# Patient Record
Sex: Female | Born: 1955 | Race: White | Hispanic: No | Marital: Married | Smoking: Former smoker
Health system: Southern US, Community
[De-identification: ages and names within clinical notes are randomized; demographics above are authoritative.]

## PROBLEM LIST (undated history)

## (undated) DIAGNOSIS — F3289 Other specified depressive episodes: Secondary | ICD-10-CM

## (undated) DIAGNOSIS — I499 Cardiac arrhythmia, unspecified: Secondary | ICD-10-CM

## (undated) DIAGNOSIS — B009 Herpesviral infection, unspecified: Secondary | ICD-10-CM

## (undated) DIAGNOSIS — K219 Gastro-esophageal reflux disease without esophagitis: Secondary | ICD-10-CM

## (undated) DIAGNOSIS — F329 Major depressive disorder, single episode, unspecified: Secondary | ICD-10-CM

## (undated) DIAGNOSIS — J45909 Unspecified asthma, uncomplicated: Secondary | ICD-10-CM

## (undated) DIAGNOSIS — M1990 Unspecified osteoarthritis, unspecified site: Secondary | ICD-10-CM

## (undated) DIAGNOSIS — H9190 Unspecified hearing loss, unspecified ear: Secondary | ICD-10-CM

## (undated) DIAGNOSIS — F32A Depression, unspecified: Secondary | ICD-10-CM

## (undated) DIAGNOSIS — G4700 Insomnia, unspecified: Secondary | ICD-10-CM

## (undated) HISTORY — DX: Major depressive disorder, single episode, unspecified: F32.9

## (undated) HISTORY — PX: TONSILLECTOMY AND ADENOIDECTOMY: SHX28

## (undated) HISTORY — DX: Unspecified hearing loss, unspecified ear: H91.90

## (undated) HISTORY — DX: Other specified depressive episodes: F32.89

## (undated) HISTORY — DX: Herpesviral infection, unspecified: B00.9

## (undated) HISTORY — PX: JOINT REPLACEMENT: SHX530

## (undated) HISTORY — DX: Gastro-esophageal reflux disease without esophagitis: K21.9

## (undated) HISTORY — DX: Insomnia, unspecified: G47.00

## (undated) HISTORY — PX: AUGMENTATION MAMMAPLASTY: SUR837

---

## 1983-10-12 HISTORY — PX: ABDOMINAL HYSTERECTOMY: SHX81

## 2004-08-18 ENCOUNTER — Ambulatory Visit: Payer: Self-pay | Admitting: General Practice

## 2005-01-15 ENCOUNTER — Ambulatory Visit: Payer: Self-pay

## 2005-01-20 ENCOUNTER — Ambulatory Visit: Payer: Self-pay

## 2005-08-18 ENCOUNTER — Inpatient Hospital Stay: Payer: Self-pay | Admitting: General Practice

## 2005-08-24 ENCOUNTER — Encounter: Payer: Self-pay | Admitting: General Practice

## 2005-09-10 ENCOUNTER — Encounter: Payer: Self-pay | Admitting: General Practice

## 2005-10-19 ENCOUNTER — Ambulatory Visit: Payer: Self-pay | Admitting: Pain Medicine

## 2006-01-24 ENCOUNTER — Ambulatory Visit: Payer: Self-pay | Admitting: Pain Medicine

## 2006-02-03 ENCOUNTER — Ambulatory Visit: Payer: Self-pay | Admitting: Pain Medicine

## 2006-02-16 ENCOUNTER — Ambulatory Visit: Payer: Self-pay | Admitting: Pain Medicine

## 2006-02-24 ENCOUNTER — Ambulatory Visit: Payer: Self-pay | Admitting: Pain Medicine

## 2006-03-02 ENCOUNTER — Ambulatory Visit: Payer: Self-pay

## 2006-03-14 ENCOUNTER — Ambulatory Visit: Payer: Self-pay | Admitting: Pain Medicine

## 2007-03-18 ENCOUNTER — Ambulatory Visit: Payer: Self-pay | Admitting: Emergency Medicine

## 2007-04-03 ENCOUNTER — Ambulatory Visit: Payer: Self-pay | Admitting: Internal Medicine

## 2007-06-13 ENCOUNTER — Ambulatory Visit: Payer: Self-pay | Admitting: Pain Medicine

## 2007-07-18 ENCOUNTER — Ambulatory Visit: Payer: Self-pay

## 2007-08-10 ENCOUNTER — Ambulatory Visit: Payer: Self-pay | Admitting: Obstetrics and Gynecology

## 2007-10-04 ENCOUNTER — Ambulatory Visit: Payer: Self-pay | Admitting: Urology

## 2009-05-29 ENCOUNTER — Ambulatory Visit: Payer: Self-pay

## 2009-06-26 ENCOUNTER — Ambulatory Visit: Payer: Self-pay | Admitting: Cardiology

## 2009-07-01 ENCOUNTER — Ambulatory Visit: Payer: Self-pay | Admitting: Cardiology

## 2009-07-25 ENCOUNTER — Other Ambulatory Visit: Payer: Self-pay | Admitting: General Surgery

## 2009-07-27 ENCOUNTER — Ambulatory Visit: Payer: Self-pay | Admitting: General Surgery

## 2010-06-02 ENCOUNTER — Ambulatory Visit: Payer: Self-pay

## 2010-06-07 ENCOUNTER — Ambulatory Visit: Payer: Self-pay | Admitting: Internal Medicine

## 2010-07-07 ENCOUNTER — Ambulatory Visit: Payer: Self-pay | Admitting: Internal Medicine

## 2010-10-11 HISTORY — PX: ESOPHAGOGASTRODUODENOSCOPY: SHX1529

## 2011-06-24 ENCOUNTER — Ambulatory Visit: Payer: Self-pay

## 2011-06-28 ENCOUNTER — Ambulatory Visit: Payer: Self-pay | Admitting: Internal Medicine

## 2011-07-15 ENCOUNTER — Ambulatory Visit: Payer: Self-pay | Admitting: Unknown Physician Specialty

## 2011-07-20 LAB — PATHOLOGY REPORT

## 2011-08-23 ENCOUNTER — Ambulatory Visit: Payer: Self-pay

## 2012-06-27 ENCOUNTER — Ambulatory Visit: Payer: Self-pay | Admitting: Internal Medicine

## 2012-06-30 ENCOUNTER — Ambulatory Visit: Payer: Self-pay | Admitting: Internal Medicine

## 2012-06-30 LAB — CBC WITH DIFFERENTIAL/PLATELET
Basophil %: 1.2 %
Eosinophil #: 0.1 10*3/uL (ref 0.0–0.7)
HCT: 41.2 % (ref 35.0–47.0)
Lymphocyte %: 38.1 %
MCHC: 34.2 g/dL (ref 32.0–36.0)
Monocyte %: 6.5 %
Neutrophil #: 2.7 10*3/uL (ref 1.4–6.5)

## 2012-06-30 LAB — HEPATIC FUNCTION PANEL A (ARMC)
Bilirubin, Direct: 0.1 mg/dL (ref 0.00–0.20)
Bilirubin,Total: 0.4 mg/dL (ref 0.2–1.0)
SGOT(AST): 14 U/L — ABNORMAL LOW (ref 15–37)
SGPT (ALT): 28 U/L (ref 12–78)
Total Protein: 7.3 g/dL (ref 6.4–8.2)

## 2012-07-16 ENCOUNTER — Ambulatory Visit: Payer: Self-pay

## 2012-08-30 ENCOUNTER — Other Ambulatory Visit: Payer: Self-pay | Admitting: Orthopedic Surgery

## 2012-09-26 ENCOUNTER — Ambulatory Visit: Payer: Self-pay | Admitting: Internal Medicine

## 2012-10-30 ENCOUNTER — Encounter (HOSPITAL_BASED_OUTPATIENT_CLINIC_OR_DEPARTMENT_OTHER): Payer: Self-pay | Admitting: *Deleted

## 2012-11-02 ENCOUNTER — Encounter (HOSPITAL_BASED_OUTPATIENT_CLINIC_OR_DEPARTMENT_OTHER): Payer: Self-pay

## 2012-11-02 ENCOUNTER — Encounter (HOSPITAL_BASED_OUTPATIENT_CLINIC_OR_DEPARTMENT_OTHER): Payer: Self-pay | Admitting: *Deleted

## 2012-11-02 ENCOUNTER — Encounter (HOSPITAL_BASED_OUTPATIENT_CLINIC_OR_DEPARTMENT_OTHER): Payer: Self-pay | Admitting: Certified Registered Nurse Anesthetist

## 2012-11-02 ENCOUNTER — Encounter (HOSPITAL_BASED_OUTPATIENT_CLINIC_OR_DEPARTMENT_OTHER)
Admission: RE | Disposition: A | Discharge status: Home / Self Care | Payer: Self-pay | Source: Ambulatory Visit | Attending: Orthopedic Surgery

## 2012-11-02 ENCOUNTER — Ambulatory Visit (HOSPITAL_BASED_OUTPATIENT_CLINIC_OR_DEPARTMENT_OTHER): Payer: 59 | Admitting: Certified Registered Nurse Anesthetist

## 2012-11-02 ENCOUNTER — Ambulatory Visit (HOSPITAL_BASED_OUTPATIENT_CLINIC_OR_DEPARTMENT_OTHER)
Admission: RE | Admit: 2012-11-02 | Discharge: 2012-11-03 | Disposition: A | Discharge status: Home / Self Care | Payer: 59 | Source: Ambulatory Visit | Attending: Orthopedic Surgery | Admitting: Orthopedic Surgery

## 2012-11-02 DIAGNOSIS — F411 Generalized anxiety disorder: Secondary | ICD-10-CM | POA: Insufficient documentation

## 2012-11-02 DIAGNOSIS — G8929 Other chronic pain: Secondary | ICD-10-CM | POA: Insufficient documentation

## 2012-11-02 DIAGNOSIS — J45909 Unspecified asthma, uncomplicated: Secondary | ICD-10-CM | POA: Insufficient documentation

## 2012-11-02 DIAGNOSIS — M19049 Primary osteoarthritis, unspecified hand: Secondary | ICD-10-CM | POA: Insufficient documentation

## 2012-11-02 HISTORY — PX: CARPOMETACARPAL (CMC) FUSION OF THUMB: SHX6290

## 2012-11-02 HISTORY — DX: Depression, unspecified: F32.A

## 2012-11-02 HISTORY — DX: Unspecified osteoarthritis, unspecified site: M19.90

## 2012-11-02 HISTORY — DX: Unspecified asthma, uncomplicated: J45.909

## 2012-11-02 HISTORY — DX: Major depressive disorder, single episode, unspecified: F32.9

## 2012-11-02 HISTORY — DX: Cardiac arrhythmia, unspecified: I49.9

## 2012-11-02 SURGERY — CARPOMETACARPAL (CMC) FUSION OF THUMB
Anesthesia: Regional | Site: Wrist | Laterality: Left | Wound class: Clean

## 2012-11-02 MED ORDER — OXYCODONE HCL 5 MG PO TABS: 5.0000 mg | ORAL_TABLET | ORAL | Status: DC | PRN

## 2012-11-02 MED ORDER — METHOCARBAMOL 500 MG PO TABS: 500.0000 mg | ORAL_TABLET | Freq: Four times a day (QID) | ORAL | Status: DC

## 2012-11-02 MED ORDER — MIDAZOLAM HCL 5 MG/5ML IJ SOLN
INTRAMUSCULAR | Status: DC | PRN
  Administered 2012-11-02: 1 mg via INTRAVENOUS

## 2012-11-02 MED ORDER — ONDANSETRON HCL 4 MG/2ML IJ SOLN
INTRAMUSCULAR | Status: DC | PRN
  Administered 2012-11-02: 4 mg via INTRAVENOUS

## 2012-11-02 MED ORDER — MIDAZOLAM HCL 2 MG/2ML IJ SOLN
1.0000 mg | INTRAMUSCULAR | Status: DC | PRN
  Administered 2012-11-02: 2 mg via INTRAVENOUS

## 2012-11-02 MED ORDER — LIDOCAINE HCL (CARDIAC) 20 MG/ML IV SOLN
INTRAVENOUS | Status: DC | PRN
  Administered 2012-11-02: 80 mg via INTRAVENOUS

## 2012-11-02 MED ORDER — LACTATED RINGERS IV SOLN
INTRAVENOUS | Status: DC
  Administered 2012-11-02: 12:00:00 via INTRAVENOUS

## 2012-11-02 MED ORDER — DEXAMETHASONE SODIUM PHOSPHATE 10 MG/ML IJ SOLN
INTRAMUSCULAR | Status: DC | PRN
  Administered 2012-11-02: 10 mg via INTRAVENOUS

## 2012-11-02 MED ORDER — FAMOTIDINE 20 MG PO TABS: 20.0000 mg | ORAL_TABLET | Freq: Two times a day (BID) | ORAL | Status: DC | PRN

## 2012-11-02 MED ORDER — PROPOFOL 10 MG/ML IV BOLUS
INTRAVENOUS | Status: DC | PRN
  Administered 2012-11-02: 200 mg via INTRAVENOUS

## 2012-11-02 MED ORDER — BUPROPION HCL ER (XL) 150 MG PO TB24: 150.0000 mg | ORAL_TABLET | Freq: Every day | ORAL | Status: DC

## 2012-11-02 MED ORDER — LACTATED RINGERS IV SOLN
INTRAVENOUS | Status: DC
  Administered 2012-11-02: 75 mL/h via INTRAVENOUS

## 2012-11-02 MED ORDER — CEFAZOLIN SODIUM 1-5 GM-% IV SOLN
1.0000 g | Freq: Three times a day (TID) | INTRAVENOUS | Status: DC
  Administered 2012-11-02 – 2012-11-03 (×3): 1 g via INTRAVENOUS

## 2012-11-02 MED ORDER — HYDROMORPHONE HCL PF 1 MG/ML IJ SOLN
0.2500 mg | INTRAMUSCULAR | Status: DC | PRN
  Administered 2012-11-02 (×4): 0.5 mg via INTRAVENOUS

## 2012-11-02 MED ORDER — FENTANYL CITRATE 0.05 MG/ML IJ SOLN
50.0000 ug | INTRAMUSCULAR | Status: DC | PRN
  Administered 2012-11-02: 100 ug via INTRAVENOUS

## 2012-11-02 MED ORDER — CHLORHEXIDINE GLUCONATE 4 % EX LIQD: 60.0000 mL | Freq: Once | CUTANEOUS | Status: DC

## 2012-11-02 MED ORDER — CEFAZOLIN SODIUM-DEXTROSE 2-3 GM-% IV SOLR
2.0000 g | INTRAVENOUS | Status: AC
  Administered 2012-11-02: 2 g via INTRAVENOUS

## 2012-11-02 MED ORDER — ALBUTEROL SULFATE HFA 108 (90 BASE) MCG/ACT IN AERS: 2.0000 | INHALATION_SPRAY | Freq: Every day | RESPIRATORY_TRACT | Status: DC | PRN

## 2012-11-02 MED ORDER — METOPROLOL SUCCINATE ER 50 MG PO TB24: 50.0000 mg | ORAL_TABLET | Freq: Every day | ORAL | Status: DC

## 2012-11-02 MED ORDER — MORPHINE SULFATE 2 MG/ML IJ SOLN
1.0000 mg | INTRAMUSCULAR | Status: DC | PRN
  Administered 2012-11-02 – 2012-11-03 (×4): 1 mg via INTRAVENOUS

## 2012-11-02 MED ORDER — ALPRAZOLAM 0.5 MG PO TABS
0.5000 mg | ORAL_TABLET | Freq: Four times a day (QID) | ORAL | Status: DC | PRN
  Administered 2012-11-03: 0.5 mg via ORAL

## 2012-11-02 MED ORDER — DOCUSATE SODIUM 100 MG PO CAPS: 100.0000 mg | ORAL_CAPSULE | Freq: Two times a day (BID) | ORAL | Status: DC

## 2012-11-02 MED ORDER — VITAMIN C 500 MG PO TABS
1000.0000 mg | ORAL_TABLET | Freq: Every day | ORAL | Status: DC
  Administered 2012-11-02: 1000 mg via ORAL

## 2012-11-02 MED ORDER — CITALOPRAM HYDROBROMIDE 40 MG PO TABS: 40.0000 mg | ORAL_TABLET | Freq: Every day | ORAL | Status: DC

## 2012-11-02 MED ORDER — OXYCODONE HCL 5 MG PO TABS
5.0000 mg | ORAL_TABLET | ORAL | Status: DC | PRN
  Administered 2012-11-02 (×2): 10 mg via ORAL

## 2012-11-02 MED ORDER — SODIUM CHLORIDE 0.45 % IV SOLN: INTRAVENOUS | Status: DC

## 2012-11-02 SURGICAL SUPPLY — 67 items
BANDAGE CONFORM 2  STR LF (GAUZE/BANDAGES/DRESSINGS) ×2 IMPLANT
BANDAGE CONFORM 3  STR LF (GAUZE/BANDAGES/DRESSINGS) IMPLANT
BANDAGE ELASTIC 3 VELCRO ST LF (GAUZE/BANDAGES/DRESSINGS) ×2 IMPLANT
BANDAGE GAUZE ELAST BULKY 4 IN (GAUZE/BANDAGES/DRESSINGS) ×2 IMPLANT
BLADE SURG 15 STRL LF DISP TIS (BLADE) ×3 IMPLANT
BLADE SURG 15 STRL SS (BLADE) ×3
BLADE SURG ROTATE 9660 (MISCELLANEOUS) IMPLANT
BRUSH SCRUB EZ PLAIN DRY (MISCELLANEOUS) ×2 IMPLANT
CANISTER SUCTION 1200CC (MISCELLANEOUS) IMPLANT
CLOTH BEACON ORANGE TIMEOUT ST (SAFETY) ×2 IMPLANT
CORDS BIPOLAR (ELECTRODE) ×2 IMPLANT
COVER MAYO STAND STRL (DRAPES) ×2 IMPLANT
COVER TABLE BACK 60X90 (DRAPES) ×2 IMPLANT
CUFF TOURNIQUET SINGLE 18IN (TOURNIQUET CUFF) ×2 IMPLANT
DECANTER SPIKE VIAL GLASS SM (MISCELLANEOUS) IMPLANT
DRAIN TLS ROUND 10FR (DRAIN) IMPLANT
DRAPE EXTREMITY T 121X128X90 (DRAPE) ×2 IMPLANT
DRAPE OEC MINIVIEW 54X84 (DRAPES) IMPLANT
DRAPE SURG 17X23 STRL (DRAPES) ×2 IMPLANT
DRSG EMULSION OIL 3X3 NADH (GAUZE/BANDAGES/DRESSINGS) ×2 IMPLANT
GAUZE SPONGE 4X4 16PLY XRAY LF (GAUZE/BANDAGES/DRESSINGS) IMPLANT
GLOVE BIO SURGEON STRL SZ 6.5 (GLOVE) ×6 IMPLANT
GLOVE BIOGEL M STRL SZ7.5 (GLOVE) ×2 IMPLANT
GLOVE BIOGEL PI IND STRL 7.0 (GLOVE) ×1 IMPLANT
GLOVE BIOGEL PI INDICATOR 7.0 (GLOVE) ×1
GLOVE SS BIOGEL STRL SZ 8 (GLOVE) ×1 IMPLANT
GLOVE SUPERSENSE BIOGEL SZ 8 (GLOVE) ×1
GOWN PREVENTION PLUS XLARGE (GOWN DISPOSABLE) ×6 IMPLANT
GOWN PREVENTION PLUS XXLARGE (GOWN DISPOSABLE) ×2 IMPLANT
GUIDEWIRE THREADED 150MM (WIRE) ×2 IMPLANT
NEEDLE HYPO 22GX1.5 SAFETY (NEEDLE) IMPLANT
NEEDLE HYPO 25X1 1.5 SAFETY (NEEDLE) ×2 IMPLANT
NS IRRIG 1000ML POUR BTL (IV SOLUTION) ×2 IMPLANT
PACK BASIN DAY SURGERY FS (CUSTOM PROCEDURE TRAY) ×2 IMPLANT
PAD CAST 3X4 CTTN HI CHSV (CAST SUPPLIES) ×2 IMPLANT
PADDING CAST ABS 3INX4YD NS (CAST SUPPLIES) ×1
PADDING CAST ABS 4INX4YD NS (CAST SUPPLIES) ×1
PADDING CAST ABS COTTON 3X4 (CAST SUPPLIES) ×1 IMPLANT
PADDING CAST ABS COTTON 4X4 ST (CAST SUPPLIES) ×1 IMPLANT
PADDING CAST COTTON 3X4 STRL (CAST SUPPLIES) ×2
PASSER SUT SWANSON 36MM LOOP (INSTRUMENTS) ×2 IMPLANT
SHEET MEDIUM DRAPE 40X70 STRL (DRAPES) ×2 IMPLANT
SPLINT FIBERGLASS 3X35 (CAST SUPPLIES) ×2 IMPLANT
SPLINT PLASTER CAST XFAST 3X15 (CAST SUPPLIES) IMPLANT
SPLINT PLASTER XTRA FASTSET 3X (CAST SUPPLIES)
SPONGE GAUZE 4X4 12PLY (GAUZE/BANDAGES/DRESSINGS) ×2 IMPLANT
SPONGE SURGIFOAM ABS GEL 12-7 (HEMOSTASIS) ×2 IMPLANT
STOCKINETTE 4X48 STRL (DRAPES) ×2 IMPLANT
STOCKINETTE SYNTHETIC 3 UNSTER (CAST SUPPLIES) ×2 IMPLANT
SUCTION FRAZIER TIP 10 FR DISP (SUCTIONS) IMPLANT
SUT BONE WAX W31G (SUTURE) IMPLANT
SUT FIBERWIRE 3-0 18 TAPR NDL (SUTURE) ×6
SUT FIBERWIRE 4-0 18 TAPR NDL (SUTURE)
SUT PROLENE 4 0 PS 2 18 (SUTURE) ×4 IMPLANT
SUT VIC AB 4-0 P-3 18XBRD (SUTURE) IMPLANT
SUT VIC AB 4-0 P3 18 (SUTURE)
SUTURE FIBERWR 3-0 18 TAPR NDL (SUTURE) ×3 IMPLANT
SUTURE FIBERWR 4-0 18 TAPR NDL (SUTURE) IMPLANT
SYR BULB 3OZ (MISCELLANEOUS) ×2 IMPLANT
SYR CONTROL 10ML LL (SYRINGE) IMPLANT
TAPE SURG TRANSPORE 1 IN (GAUZE/BANDAGES/DRESSINGS) IMPLANT
TAPE SURGICAL TRANSPORE 1 IN (GAUZE/BANDAGES/DRESSINGS)
TOWEL OR 17X24 6PK STRL BLUE (TOWEL DISPOSABLE) ×4 IMPLANT
TOWEL OR NON WOVEN STRL DISP B (DISPOSABLE) ×2 IMPLANT
TUBE CONNECTING 20X1/4 (TUBING) IMPLANT
UNDERPAD 30X30 INCONTINENT (UNDERPADS AND DIAPERS) ×2 IMPLANT
WATER STERILE IRR 1000ML POUR (IV SOLUTION) ×2 IMPLANT

## 2012-11-02 NOTE — Progress Notes (Signed)
Assisted Dr. Edwards with left, ultrasound guided, supraclavicular block. Side rails up, monitors on throughout procedure. See vital signs in flow sheet. Tolerated Procedure well. 

## 2012-11-02 NOTE — H&P (Signed)
Veronica Clayton is an 57 y.o. female.   Chief Complaint: Left thumb CMC Dz HPI: Marland KitchenMarland KitchenPatient presents for evaluation and treatment of the of their upper extremity predicament. The patient denies neck back chest or of abdominal pain. The patient notes that they have no lower extremity problems. The patient from primarily complains of the upper extremity pain noted.  Past Medical History  Diagnosis Date  . Dysrhythmia dx 2008    tachycardia  . Asthma     bronchial asthma-no issue recently  . Arthritis   . Depression     Past Surgical History  Procedure Date  . Abdominal hysterectomy 1985  . Joint replacement 2005/2006    knee-Rt partial/ Lt-total  . Tonsillectomy   . Tonsillectomy and adenoidectomy as child    History reviewed. No pertinent family history. Social History:  reports that she quit smoking about 5 years ago. Her smoking use included Cigarettes. She has a 20 pack-year smoking history. She has never used smokeless tobacco. She reports that she drinks alcohol. Her drug history not on file.  Allergies:  Allergies  Allergen Reactions  . Tetracyclines & Related Hives  . Tape Itching    Plastic tape and bandaids causes redness and itching    Medications Prior to Admission  Medication Sig Dispense Refill  . acetaminophen (TYLENOL) 500 MG tablet Take 1,000 mg by mouth every 6 (six) hours as needed.      Marland Kitchen albuterol (PROVENTIL HFA;VENTOLIN HFA) 108 (90 BASE) MCG/ACT inhaler Inhale 2 puffs into the lungs daily as needed.      Marland Kitchen aspirin 81 MG tablet Take 81 mg by mouth daily.      Marland Kitchen buPROPion (WELLBUTRIN XL) 150 MG 24 hr tablet Take 150 mg by mouth daily.      . citalopram (CELEXA) 40 MG tablet Take 40 mg by mouth daily.      Marland Kitchen estradiol (ESTRACE) 1 MG tablet Take 1 mg by mouth daily.      Marland Kitchen ibuprofen (ADVIL,MOTRIN) 800 MG tablet Take 800 mg by mouth at bedtime.      . metoprolol succinate (TOPROL-XL) 50 MG 24 hr tablet Take 50 mg by mouth daily. Take with or immediately  following a meal.      . Multiple Vitamins-Minerals (PRESERVISION/LUTEIN) CAPS Take 1 capsule by mouth daily.      Marland Kitchen zolpidem (AMBIEN) 10 MG tablet Take 10 mg by mouth at bedtime as needed.        No results found for this or any previous visit (from the past 48 hour(s)). No results found.  Review of Systems  Constitutional: Negative.   HENT: Negative.   Eyes: Negative.   Respiratory: Negative.   Cardiovascular: Negative.   Gastrointestinal: Negative.   Genitourinary: Negative.   Skin: Negative.   Endo/Heme/Allergies: Negative.     Blood pressure 123/66, pulse 80, temperature 97.6 F (36.4 C), temperature source Oral, resp. rate 18, height 5\' 6"  (1.676 m), weight 77.111 kg (170 lb), SpO2 100.00%. Physical Exam ..The patient is alert and oriented in no acute distress the patient complains of pain in the affected upper extremity. The patient is noted to have a normal HEENT exam. Lung fields show equal chest expansion and no shortness of breath abdomen exam is nontender without distention. Lower extremity examination does not show any fracture dislocation or blood clot symptoms. Pelvis is stable neck and back are stable and nontender  Assessment/Plan Plan Left CMC Aplasty .Marland KitchenWe are planning surgery for your upper extremity. The risk and  benefits of surgery include risk of bleeding infection anesthesia damage to normal structures and failure of the surgery to accomplish its intended goals of relieving symptoms and restoring function with this in mind we'll going to proceed. I have specifically discussed with the patient the pre-and postoperative regime and the does and don'ts and risk and benefits in great detail. Risk and benefits of surgery also include risk of dystrophy chronic nerve pain failure of the healing process to go onto completion and other inherent risks of surgery The relavent the pathophysiology of the disease/injury process, as well as the alternatives for treatment and  postoperative course of action has been discussed in great detail with the patient who desires to proceed.  We will do everything in our power to help you (the patient) restore function to the upper extremity. Is a pleasure to see this patient today.   Veronica Clayton Veronica Clayton,Micholas Drumwright M 11/02/2012, 1:18 PM

## 2012-11-02 NOTE — Anesthesia Preprocedure Evaluation (Addendum)
Anesthesia Evaluation  Patient identified by MRN, date of birth, ID band Patient awake    Airway Mallampati: II      Dental   Pulmonary asthma ,  breath sounds clear to auscultation        Cardiovascular Rhythm:Regular Rate:Normal     Neuro/Psych Anxiety    GI/Hepatic negative GI ROS, Neg liver ROS,   Endo/Other  negative endocrine ROS  Renal/GU negative Renal ROS     Musculoskeletal   Abdominal   Peds  Hematology   Anesthesia Other Findings   Reproductive/Obstetrics                          Anesthesia Physical Anesthesia Plan  ASA: II  Anesthesia Plan: General   Post-op Pain Management: MAC Combined w/ Regional for Post-op pain   Induction: Intravenous  Airway Management Planned: LMA  Additional Equipment:   Intra-op Plan:   Post-operative Plan: Extubation in OR  Informed Consent: I have reviewed the patients History and Physical, chart, labs and discussed the procedure including the risks, benefits and alternatives for the proposed anesthesia with the patient or authorized representative who has indicated his/her understanding and acceptance.   Dental advisory given  Plan Discussed with: CRNA, Anesthesiologist and Surgeon  Anesthesia Plan Comments:         Anesthesia Quick Evaluation

## 2012-11-02 NOTE — Anesthesia Postprocedure Evaluation (Signed)
  Anesthesia Post-op Note  Patient: Veronica Clayton  Procedure(s) Performed: Procedure(s) (LRB) with comments: CARPOMETACARPAL (CMC) FUSION OF THUMB (Left) - LEFT CMC ARTHROPLASTY WITH DOUBLE TENDON TRANSFER AND REPAIR RECONSTRUCTION AS NECCESSARY  Patient Location: PACU  Anesthesia Type:GA combined with regional for post-op pain  Level of Consciousness: awake, alert  and oriented  Airway and Oxygen Therapy: Patient Spontanous Breathing and Patient connected to face mask oxygen  Post-op Pain: mild  Post-op Assessment: Post-op Vital signs reviewed  Post-op Vital Signs: Reviewed  Complications: No apparent anesthesia complications

## 2012-11-02 NOTE — Op Note (Signed)
See dictation #[id number] Dominica Severin MD

## 2012-11-02 NOTE — Transfer of Care (Signed)
Immediate Anesthesia Transfer of Care Note  Patient: Veronica Clayton  Procedure(s) Performed: Procedure(s) (LRB) with comments: CARPOMETACARPAL (CMC) FUSION OF THUMB (Left) - LEFT CMC ARTHROPLASTY WITH DOUBLE TENDON TRANSFER AND REPAIR RECONSTRUCTION AS NECCESSARY  Patient Location: PACU  Anesthesia Type:GA combined with regional for post-op pain  Level of Consciousness: awake, alert  and oriented  Airway & Oxygen Therapy: Patient Spontanous Breathing and Patient connected to face mask oxygen  Post-op Assessment: Report given to PACU RN, Post -op Vital signs reviewed and stable and Patient moving all extremities  Post vital signs: Reviewed and stable  Complications: No apparent anesthesia complications

## 2012-11-02 NOTE — Anesthesia Procedure Notes (Addendum)
Anesthesia Regional Block:  Supraclavicular block  Pre-Anesthetic Checklist: ,, timeout performed, Correct Patient, Correct Site, Correct Laterality, Correct Procedure, Correct Position, site marked, Risks and benefits discussed,  Surgical consent,  Pre-op evaluation,  At surgeon's request and post-op pain management  Laterality: Left  Prep: Maximum Sterile Barrier Precautions used and chloraprep       Needles:  Injection technique: Single-shot  Needle Type: Echogenic Needle          Additional Needles:  Procedures: Doppler guided, ultrasound guided (picture in chart) and nerve stimulator Supraclavicular block  Nerve Stimulator or Paresthesia:  Response: 0.5 mA,   Additional Responses:   Narrative:  Start time: 11/02/2012 12:30 PM End time: 11/02/2012 12:45 PM Injection made incrementally with aspirations every 5 mL. Anesthesiologist: Dr. Ivin Booty  Supraclavicular block Procedure Name: LMA Insertion Date/Time: 11/02/2012 1:30 PM Performed by: Verlan Friends Pre-anesthesia Checklist: Patient identified, Emergency Drugs available, Suction available, Patient being monitored and Timeout performed Patient Re-evaluated:Patient Re-evaluated prior to inductionOxygen Delivery Method: Circle System Utilized Preoxygenation: Pre-oxygenation with 100% oxygen Intubation Type: IV induction Ventilation: Mask ventilation without difficulty LMA: LMA inserted LMA Size: 4.0 Number of attempts: 1 Airway Equipment and Method: bite block Placement Confirmation: positive ETCO2 Tube secured with: Tape (paper tape used) Dental Injury: Teeth and Oropharynx as per pre-operative assessment

## 2012-11-03 ENCOUNTER — Encounter (HOSPITAL_BASED_OUTPATIENT_CLINIC_OR_DEPARTMENT_OTHER): Payer: Self-pay | Admitting: Orthopedic Surgery

## 2012-11-03 MED ORDER — METHOCARBAMOL 100 MG/ML IJ SOLN: 500.0000 mg | Freq: Three times a day (TID) | INTRAVENOUS | Status: DC

## 2012-11-03 MED ORDER — OXYCODONE-ACETAMINOPHEN 5-325 MG PO TABS
1.0000 | ORAL_TABLET | ORAL | Status: AC | PRN
Start: 2012-11-03 — End: 2012-11-03
  Administered 2012-11-03: 2 via ORAL

## 2012-11-03 NOTE — Op Note (Signed)
NAMEMILANY, GECK             ACCOUNT NO.:  [id number]  MEDICAL RECORD NO.:  [id number]  LOCATION:                                 FACILITY:  PHYSICIAN:  Dionne Ano. Nikeshia Keetch, M.D.DATE OF BIRTH:  1956/06/30  DATE OF PROCEDURE:  11/02/2012 DATE OF DISCHARGE:                              OPERATIVE REPORT   PREOPERATIVE DIAGNOSIS:  Chronic carpometacarpal arthritis, left basilar thumb joint with failure of conservative management, and chronic pain.  POSTOPERATIVE DIAGNOSIS:  Chronic carpometacarpal arthritis, left basilar thumb joint with failure of conservative management, and chronic pain.  PROCEDURE: 1. Left thumb CMC arthroplasty (removal of the trapezium at the base     of thumb joint region) left basilar thumb joint. 2. Abductors pollicis longus digastric portion.  Tendon transfer to     the first metacarpal FCR and back upon itself in the first     metacarpal (Zancolli tendon transfer) left basilar thumb joint. 3. Adductor pollicis longus 1/3 proper portion, tendon transfer to the     flexor carpi radialis, APL proper, back upon themselves (Weilby     tendon transfer), left basilar thumb joint. 4. Abductor pollicis longus tenodesis (shortening of wrist extensors     at wrist forearm level to prevent dorsal-lateral escape, left     basilar thumb joint.)  SURGEON:  Dionne Ano. Amanda Pea, M.D.  ASSISTANT:  Karie Chimera, P.A.-C.  COMPLICATIONS:  None.  ANESTHESIA:  General with preoperative block.  TOURNIQUET TIME:  44 minutes.  INDICATIONS:  Pleasant female, presents with above-mentioned diagnosis. She understands risks and benefits of surgery.  She desires to proceed with above-mentioned operative intervention.  All questions have been encouraged and answered preoperatively.  PROCEDURE:  The patient was seen by myself and Anesthesia, taken to the operative suite, underwent smooth induction of general anaesthetic, laid supine, fully padded, prepped and draped in  usual sterile fashion with Betadine scrub and paint.  This was a 10-minute surgical Betadine scrub, time-out was called.  Pre and postop check was complete.  Ancef was given preoperatively and following this, the patient was prepped and draped in usual sterile fashion as mentioned.  Once sterile field was secured.  Arm was elevated, tourniquet was insufflated to 200 mmHg. Incision was made.  Dissection was carried down between the APL and EPB tendons.  Superficial radial nerve was identified and underwent a neurolysis and was swept out of harm's way.  The EPB had a remnant type tendon to it with poor attachments proximally.  This was gently released.  Following this, I incised the capsule removed the trapezium piecemeal,  performed FCR tenolysis, tenosynovectomy, and performed a first dorsal compartment release as well.  I made a hole dorsal to palmar exiting intra-articularly in line with the palmar beak ligament. This was enlarged to a 3-0 drill bit approximately.  Following this, she was irrigated and this completed the arthroplasty portion of the procedure with all bone spurs and the entire trapezium excised.  Following this, APL digastric portion and once a proper portion of the APL were harvested through a small counterincision at the distal dorsal third of the wrist, through a small incision less than an inch.  Once this was done,  the tendons were retracted into the main wound and I performed tendon transfer of the APL digastric portion through the drill hole dorsal to palmar exiting around the FCR twice, then back to itself twice, and back into the drill hole.  This was secured without difficulty.  Following securing the tendon transfer, I then performed very careful and cautious second tendon transfer.  The first tendon transfer is known as the Zancolli tendon transfer of course.  Following this, I performed APL, went through a proper portion of tendon transfer to the FCR, then  back around the APL, and with multiple figure-of-eight rows, I secured this construct.  Following this, I then performed very careful and cautious irrigation and noted the second tendon transfer (Weilby tendon transfer) was in excellent shape.  Following this, additional irrigation was applied.  I then shortened the APL tendon. This was tenodesis of the APL to prevent dorsal-lateral escape and was done with FiberWire.  The patient tolerated this well.  Once this done, the patient underwent very careful and cautious irrigation followed by placement of Gelfoam in the space created.  The tension looked excellent.  I closed the capsule with FiberWire.  The superficial radial nerve was intact and out of harms way.  I then reclosed the wound with Prolene without difficulty.  Sterile dressing was applied, splint placed, and the patient had no complicating features.  All sponge, needle, and instrument counts were correct.  She will be seen for IV antibiotics.  General postop observation, and will notify me, should any problems occur.  We will proceed according to our standard Zancolli protocol postoperatively.  It was an absolute pleasure to participate in her care.  We look forward to participate in her postop recovery.     Dionne Ano. Amanda Pea, M.D.     [facility]  D:  11/02/2012  T:  11/03/2012  Job:  [id number]

## 2012-11-03 NOTE — Discharge Summary (Signed)
  Patient is doing well she is neurovascularly intact she had an uneventful overnight stay. Final diagnosis status post CMC thumb reconstruction left upper extremity.  Chloe Flis M.D.

## 2012-11-03 NOTE — Progress Notes (Signed)
Patient ID: Veronica Clayton, female   DOB: 12/13/1955, 57 y.o.   MRN: [id number] Patient is doing well  She is neurovascular intact there's no complications. She looks quite good in terms of her overnight stay and functional aspects of her extremity.  Will plan for discharge today. She'll return to see me in 8 days and she'll notify me same problems occur  The patient is alert and oriented in no acute distress the patient complains of pain in the affected upper extremity. The patient is noted to have a normal HEENT exam. Lung fields show equal chest expansion and no shortness of breath abdomen exam is nontender without distention. Lower extremity examination does not show any fracture dislocation or blood clot symptoms. Pelvis is stable neck and back are stable and nontender  We went over all aspects of her surgery care and followup  She is discharged to home Dionne Ano. Zeyad Delaguila M.D.

## 2013-07-05 ENCOUNTER — Ambulatory Visit: Payer: Self-pay | Admitting: Internal Medicine

## 2013-07-19 ENCOUNTER — Encounter: Payer: Self-pay | Admitting: *Deleted

## 2013-07-20 ENCOUNTER — Ambulatory Visit (INDEPENDENT_AMBULATORY_CARE_PROVIDER_SITE_OTHER): Payer: 59 | Admitting: Cardiovascular Disease

## 2013-07-20 ENCOUNTER — Encounter: Payer: Self-pay | Admitting: Cardiovascular Disease

## 2013-07-20 VITALS — BP 126/78 | HR 60 | Ht 66.0 in | Wt 174.2 lb

## 2013-07-20 DIAGNOSIS — R002 Palpitations: Secondary | ICD-10-CM

## 2013-07-20 DIAGNOSIS — E785 Hyperlipidemia, unspecified: Secondary | ICD-10-CM

## 2013-07-20 NOTE — Assessment & Plan Note (Signed)
Palpitations could be related to premature beats. He does have family history of atrial fibrillation as well. I will obtain a 48-hour course the monitor for evaluation. ECG is slightly abnormal but she has no symptoms of angina or heart failure. Cardiac evaluation with a stress test and echocardiogram in 2010 was unremarkable.

## 2013-07-20 NOTE — Progress Notes (Signed)
Primary care physician: Dr. Judithann Graves  HPI  Veronica Clayton is a pleasant 57 year old female who was referred for evaluation of palpitations and possible atrial fibrillation. 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 She has been overall healthy with no significant chronic medical conditions. She was evaluated for chest pain and palpitations in 2010 by Dr. Wendi Maya. She underwent an echocardiogram which showed normal LV systolic function with mild mitral and tricuspid regurgitation. Nuclear stress test showed no evidence of ischemia. She was treated with metoprolol for an unspecified tachycardia. She has been taking 50 mg daily for a few years. She has noted recent episodes of skipping and fast heartbeats which typically don't last for a long time. She denies any dizziness, syncope or presyncope. No chest pain or dyspnea. Her mother had atrial fibrillation. She does not consume excessive amounts of caffeine. Recent labs were unremarkable including thyroid function.  Allergies  Allergen Reactions  . Tetracyclines & Related Hives  . Tape Itching    Plastic tape and bandaids causes redness and itching     Current Outpatient Prescriptions on File Prior to Visit  Medication Sig Dispense Refill  . acetaminophen (TYLENOL) 500 MG tablet Take 1,000 mg by mouth every 6 (six) hours as needed.      Marland Kitchen albuterol (PROVENTIL HFA;VENTOLIN HFA) 108 (90 BASE) MCG/ACT inhaler Inhale 2 puffs into the lungs daily as needed.      Marland Kitchen aspirin 81 MG tablet Take 81 mg by mouth daily.      . citalopram (CELEXA) 40 MG tablet Take 40 mg by mouth daily.      Marland Kitchen estradiol (ESTRACE) 1 MG tablet Take 1 mg by mouth 2 (two) times daily.       Marland Kitchen ibuprofen (ADVIL,MOTRIN) 800 MG tablet Take 800 mg by mouth at bedtime.      . metoprolol succinate (TOPROL-XL) 50 MG 24 hr tablet Take 50 mg by mouth daily. Take with or immediately following a meal.      . Multiple Vitamins-Minerals (PRESERVISION/LUTEIN) CAPS Take  1 capsule by mouth daily.      Marland Kitchen zolpidem (AMBIEN) 10 MG tablet Take 10 mg by mouth at bedtime as needed.       No current facility-administered medications on file prior to visit.     Past Medical History  Diagnosis Date  . Dysrhythmia dx 2008    tachycardia  . Asthma     bronchial asthma-no issue recently  . Arthritis   . Depression   . Herpes simplex without mention of complication   . Unspecified hearing loss   . Depressive disorder, not elsewhere classified   . Insomnia, unspecified   . GERD (gastroesophageal reflux disease)      Past Surgical History  Procedure Laterality Date  . Abdominal hysterectomy  1985  . Joint replacement  2005/2006    knee-Rt partial/ Lt-total  . Tonsillectomy    . Tonsillectomy and adenoidectomy  as child  . Carpometacarpal (cmc) fusion of thumb  11/02/2012    Procedure: CARPOMETACARPAL (CMC) FUSION OF THUMB;  Surgeon: Dominica Severin, MD;  Location: [facility];  Service: Orthopedics;  Laterality: Left;  LEFT CMC ARTHROPLASTY WITH DOUBLE TENDON TRANSFER AND REPAIR RECONSTRUCTION AS NECCESSARY     Family History  Problem Relation Age of Onset  . Scleroderma Father   . Colon cancer Father   . Hypertension Mother   . Hyperlipidemia Mother      History   Social History  .  Marital Status: Married    Spouse Name: N/A    Number of Children: N/A  . Years of Education: N/A   Occupational History  . Not on file.   Social History Main Topics  . Smoking status: Former Smoker -- 1.00 packs/day for 20 years    Types: Cigarettes    Quit date: 10/31/2007  . Smokeless tobacco: Never Used  . Alcohol Use: Yes     Comment: occ beer  . Drug Use: No  . Sexual Activity: Not on file   Other Topics Concern  . Not on file   Social History Narrative  . No narrative on file     ROS A 10 point review of system was performed. It is negative other than what is mentioned in history of present illness.  PHYSICAL EXAM   BP  126/78  Pulse 60  Ht 5\' 6"  (1.676 m)  Wt 174 lb 4 oz (79.039 kg)  BMI 28.14 kg/m2 Constitutional: She is oriented to person, place, and time. She appears well-developed and well-nourished. No distress.  HENT: No nasal discharge.  Head: Normocephalic and atraumatic.  Eyes: Pupils are equal and round. Right eye exhibits no discharge. Left eye exhibits no discharge.  Neck: Normal range of motion. Neck supple. No JVD present. No thyromegaly present.  Cardiovascular: Normal rate, regular rhythm, normal heart sounds. Exam reveals no gallop and no friction rub. No murmur heard.  Pulmonary/Chest: Effort normal and breath sounds normal. No stridor. No respiratory distress. She has no wheezes. She has no rales. She exhibits no tenderness.  Abdominal: Soft. Bowel sounds are normal. She exhibits no distension. There is no tenderness. There is no rebound and no guarding.  Musculoskeletal: Normal range of motion. She exhibits no edema and no tenderness.  Neurological: She is alert and oriented to person, place, and time. Coordination normal.  Skin: Skin is warm and dry. No rash noted. She is not diaphoretic. No erythema. No pallor.  Psychiatric: She has a normal mood and affect. Her behavior is normal. Judgment and thought content normal.     EKG: Normal sinus rhythm with nonspecific ST and T wave changes in the anterior leads.   ASSESSMENT AND PLAN

## 2013-07-20 NOTE — Assessment & Plan Note (Signed)
I reviewed her recent lipid profile which showed a total cholesterol of 254, triglyceride of 199, HDL 40 and an LDL of 166. Her 10 year risk for  ASCVD is 2.7% which is overall low. Thus, no indication for statin treatment at this point. I advised her to improve her diet and start an exercise program.

## 2013-07-20 NOTE — Patient Instructions (Signed)
Your physician has recommended that you wear a holter monitor. Holter monitors are medical devices that record the heart's electrical activity. Doctors most often use these monitors to diagnose arrhythmias. Arrhythmias are problems with the speed or rhythm of the heartbeat. The monitor is a small, portable device. You can wear one while you do your normal daily activities. This is usually used to diagnose what is causing palpitations/syncope (passing out).  Follow up as needed.  

## 2013-07-27 ENCOUNTER — Encounter: Payer: Self-pay | Admitting: *Deleted

## 2013-07-27 ENCOUNTER — Telehealth: Payer: Self-pay | Admitting: *Deleted

## 2013-07-27 NOTE — Telephone Encounter (Signed)
I called the patient and notified her of Holter monitor which showed PVCs.  I want her to be scheduled for an echocardiogram (PVCs). She prefers 10/27 or 10/30.

## 2013-07-27 NOTE — Telephone Encounter (Signed)
Please call with holter moniter she is going out of town on Sunday

## 2013-07-27 NOTE — Telephone Encounter (Signed)
We just got report back today. Pt would like results by the end of the day, as she is going out of town this weekend.  Thank you!

## 2013-07-30 ENCOUNTER — Other Ambulatory Visit: Payer: Self-pay

## 2013-07-30 ENCOUNTER — Encounter (INDEPENDENT_AMBULATORY_CARE_PROVIDER_SITE_OTHER): Payer: 59

## 2013-07-30 DIAGNOSIS — R002 Palpitations: Secondary | ICD-10-CM

## 2013-08-02 ENCOUNTER — Telehealth: Payer: Self-pay

## 2013-08-02 NOTE — Telephone Encounter (Signed)
Message copied by Coralee Rud on Thu Aug 02, 2013  8:55 AM ------      Message from: Rhea Belton R      Created: Wed Aug 01, 2013 12:33 PM       Dr. Kirke Corin sent me a note:             "I want her to be scheduled for an echocardiogram (PVCs). She prefers 10/27 or 10/30."            Could you set this up for her?  I have not yet mastered the echo schedule.  It scares me.         ------

## 2013-08-02 NOTE — Telephone Encounter (Signed)
LMOM to call back.(schedule echo)

## 2013-08-16 ENCOUNTER — Other Ambulatory Visit: Payer: Self-pay

## 2013-08-16 DIAGNOSIS — R002 Palpitations: Secondary | ICD-10-CM

## 2013-08-16 DIAGNOSIS — R079 Chest pain, unspecified: Secondary | ICD-10-CM

## 2013-08-17 ENCOUNTER — Ambulatory Visit: Payer: Self-pay | Admitting: Family Medicine

## 2013-08-21 ENCOUNTER — Other Ambulatory Visit (INDEPENDENT_AMBULATORY_CARE_PROVIDER_SITE_OTHER): Payer: 59

## 2013-08-21 ENCOUNTER — Other Ambulatory Visit: Payer: Self-pay

## 2013-08-21 DIAGNOSIS — R079 Chest pain, unspecified: Secondary | ICD-10-CM

## 2013-08-21 DIAGNOSIS — I4891 Unspecified atrial fibrillation: Secondary | ICD-10-CM

## 2013-08-21 DIAGNOSIS — R002 Palpitations: Secondary | ICD-10-CM

## 2013-08-21 DIAGNOSIS — R9431 Abnormal electrocardiogram [ECG] [EKG]: Secondary | ICD-10-CM

## 2013-08-24 ENCOUNTER — Telehealth: Payer: Self-pay

## 2013-08-24 NOTE — Telephone Encounter (Signed)
Spoke w/ pt.  He is aware of results.  

## 2013-08-24 NOTE — Telephone Encounter (Signed)
Pt would like echo results 

## 2013-08-24 NOTE — Telephone Encounter (Signed)
Message copied by Marilynne Halsted on Fri Aug 24, 2013 10:04 AM ------      Message from: Veronica Clayton      Created: Thu Aug 23, 2013  4:40 PM       Inform patient that echo was normal. ------

## 2013-10-02 ENCOUNTER — Encounter: Payer: Self-pay | Admitting: Family Medicine

## 2013-10-11 ENCOUNTER — Encounter: Payer: Self-pay | Admitting: Family Medicine

## 2013-11-11 ENCOUNTER — Encounter: Payer: Self-pay | Admitting: Family Medicine

## 2014-01-15 ENCOUNTER — Ambulatory Visit: Payer: Self-pay | Admitting: Orthopedic Surgery

## 2014-01-15 LAB — BASIC METABOLIC PANEL
Anion Gap: 7 (ref 7–16)
BUN: 16 mg/dL (ref 7–18)
CO2: 29 mmol/L (ref 21–32)
Calcium, Total: 9.1 mg/dL (ref 8.5–10.1)
Chloride: 104 mmol/L (ref 98–107)
Creatinine: 0.82 mg/dL (ref 0.60–1.30)
EGFR (Non-African Amer.): >60
GLUCOSE: 94 mg/dL (ref 65–99)
Osmolality: 280 (ref 275–301)
Potassium: 4.4 mmol/L (ref 3.5–5.1)
SODIUM: 140 mmol/L (ref 136–145)

## 2014-01-15 LAB — CBC
HCT: 41 % (ref 35.0–47.0)
HGB: 13.7 g/dL (ref 12.0–16.0)
MCH: 30.9 pg (ref 26.0–34.0)
MCHC: 33.5 g/dL (ref 32.0–36.0)
MCV: 92 fL (ref 80–100)
Platelet: 224 10*3/uL (ref 150–440)
RBC: 4.45 10*6/uL (ref 3.80–5.20)
RDW: 13.3 % (ref 11.5–14.5)
WBC: 4.6 10*3/uL (ref 3.6–11.0)

## 2014-01-15 LAB — PROTIME-INR
INR: 1
PROTHROMBIN TIME: 13 s (ref 11.5–14.7)

## 2014-01-15 LAB — APTT: Activated PTT: 32 secs (ref 23.6–35.9)

## 2014-01-16 ENCOUNTER — Ambulatory Visit: Payer: Self-pay | Admitting: Orthopedic Surgery

## 2014-02-04 ENCOUNTER — Encounter: Payer: Self-pay | Admitting: Orthopedic Surgery

## 2014-02-08 ENCOUNTER — Encounter: Payer: Self-pay | Admitting: Orthopedic Surgery

## 2014-03-11 ENCOUNTER — Encounter: Payer: Self-pay | Admitting: Orthopedic Surgery

## 2014-04-10 ENCOUNTER — Encounter: Payer: Self-pay | Admitting: Orthopedic Surgery

## 2014-05-11 ENCOUNTER — Encounter: Payer: Self-pay | Admitting: Orthopedic Surgery

## 2014-06-11 ENCOUNTER — Encounter: Payer: Self-pay | Admitting: Orthopedic Surgery

## 2014-07-09 ENCOUNTER — Ambulatory Visit: Payer: Self-pay | Admitting: Internal Medicine

## 2014-11-04 IMAGING — CR DG CLAVICLE*L*
1 series · 2 of 2 positions shown · non-contrast
Comparison: Right shoulder same day

CLINICAL DATA: For comparison

EXAM:
LEFT CLAVICLE - 2+ VIEWS

[Series 1: ap/pa · 0.17mm/px · 2 of 2 slices shown]
[im 1/2]
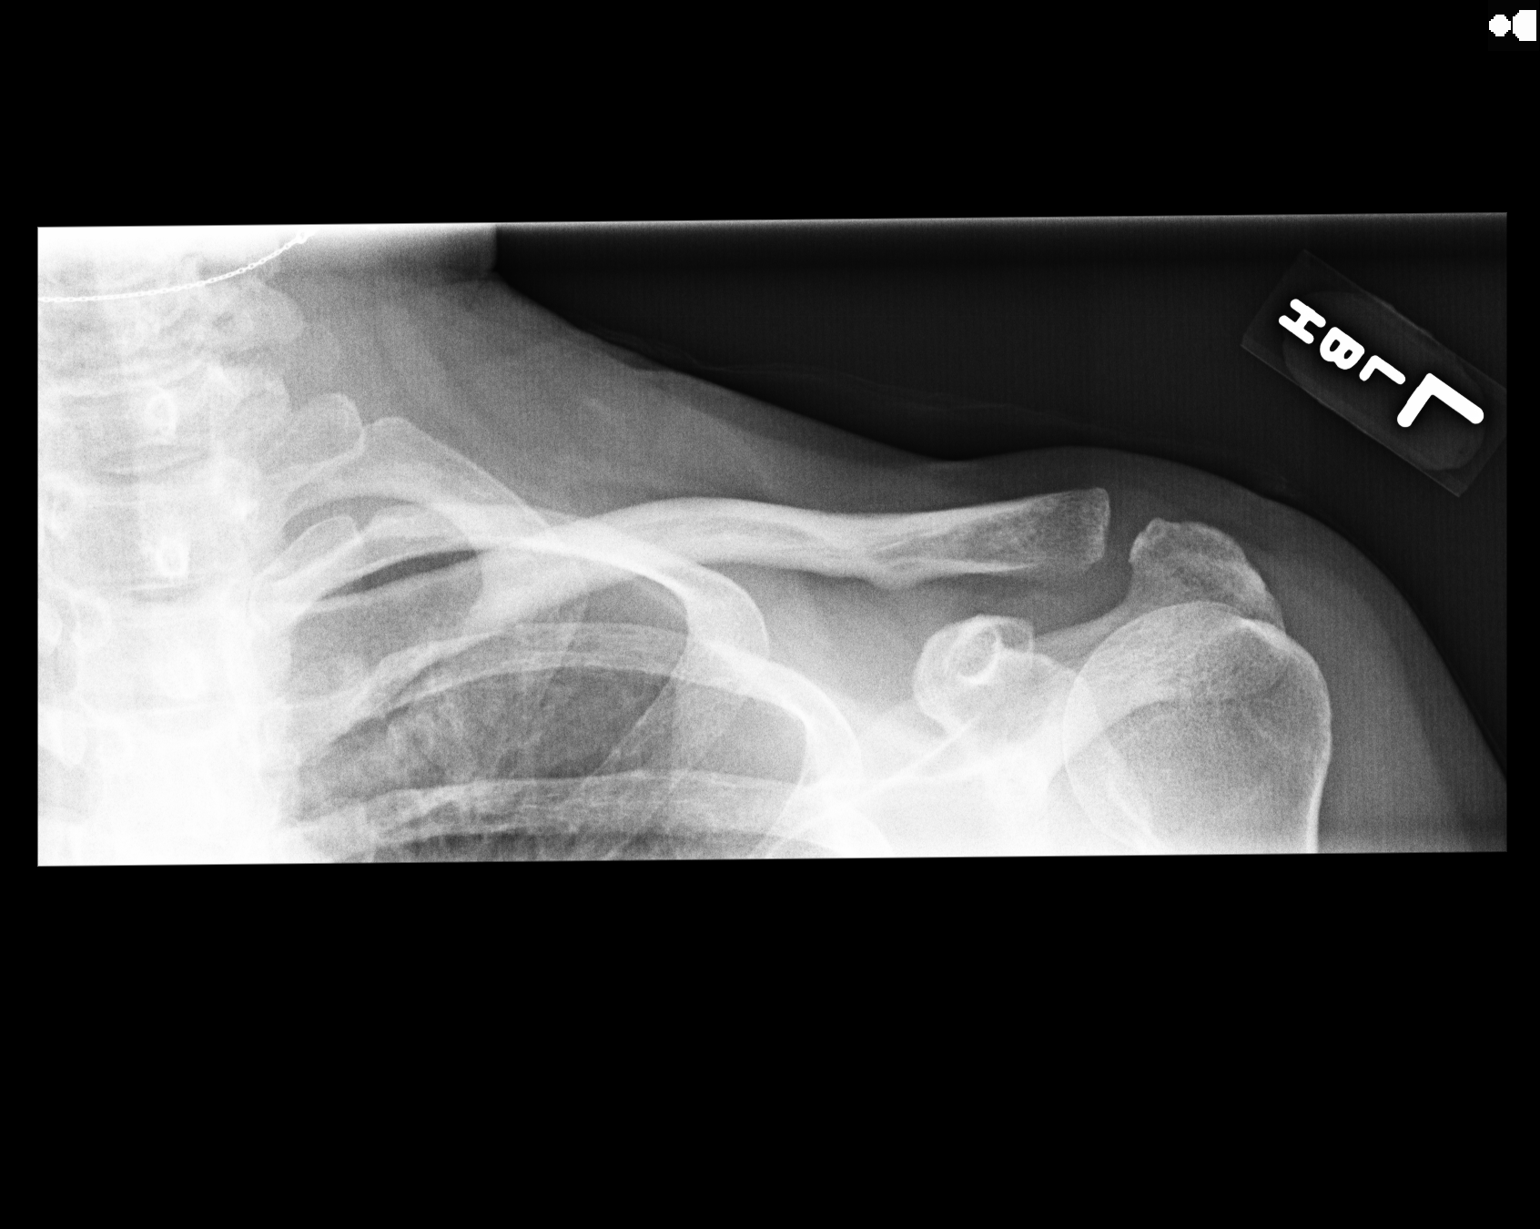
[im 2/2]
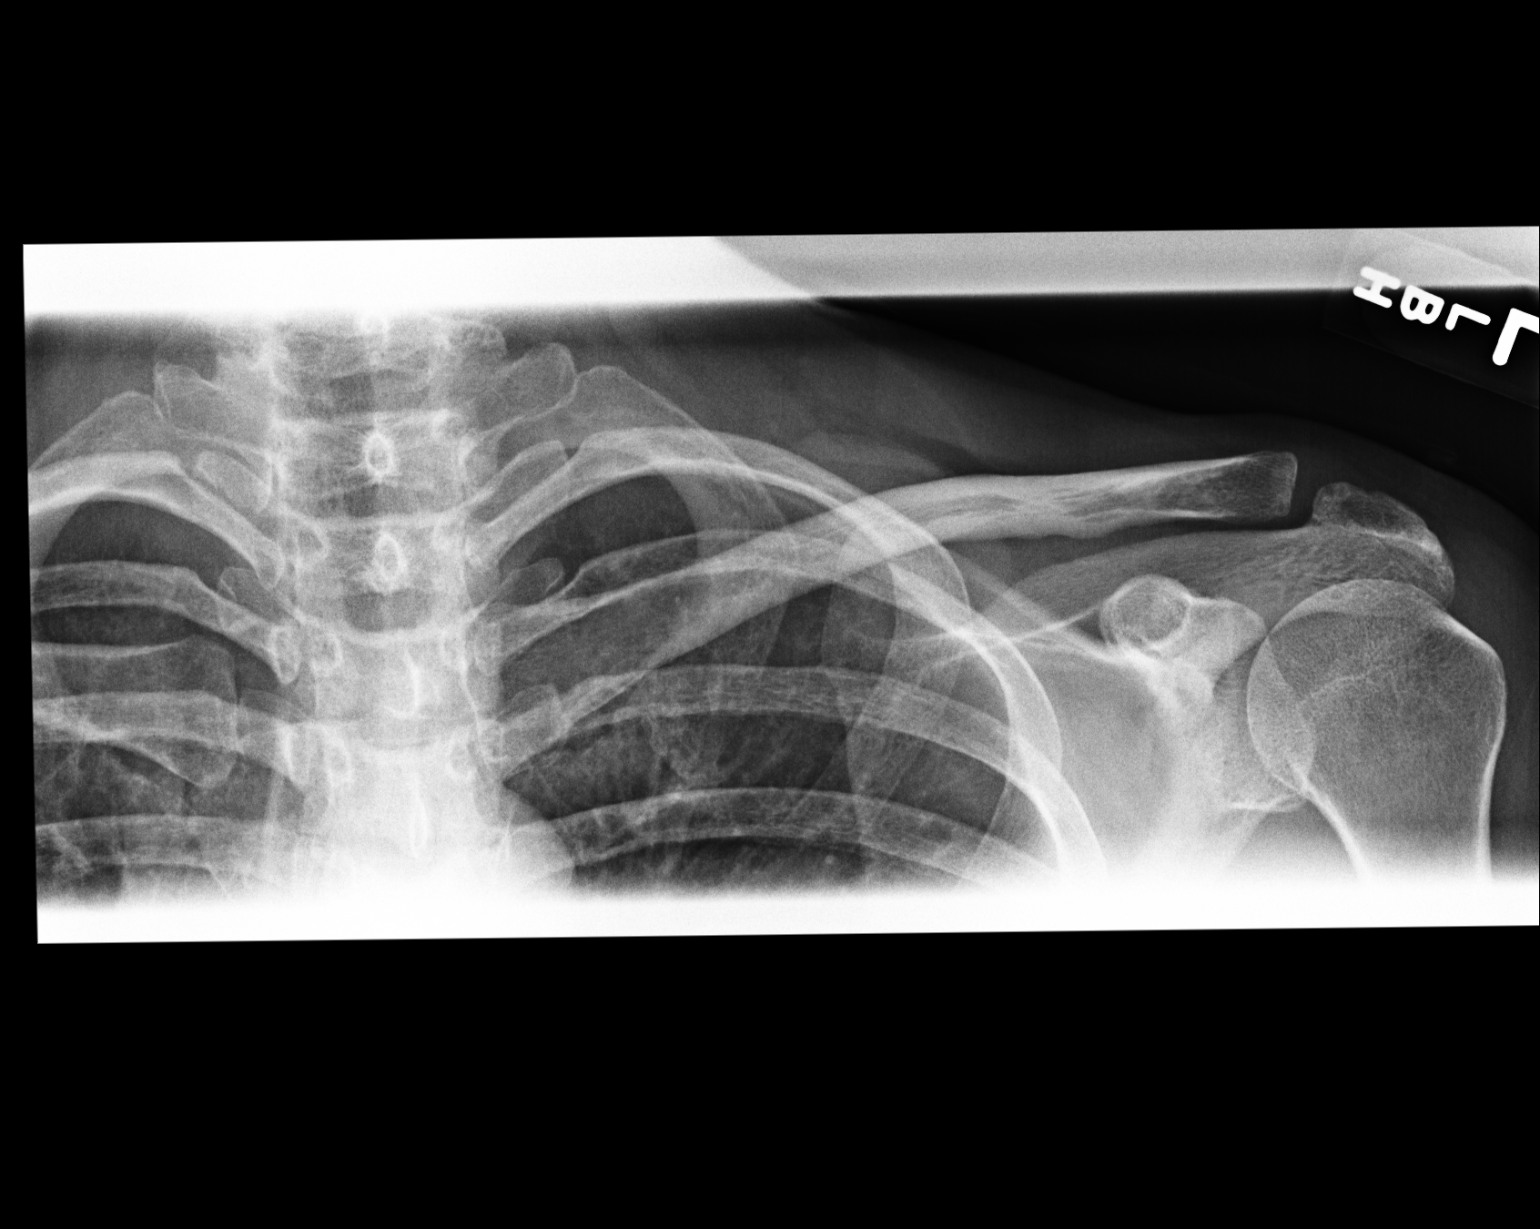

[2 of 2 positions shown; findings below may reference images not displayed]

FINDINGS: Two views of left clavicle submitted. No acute fracture or
subluxation. No radiopaque foreign body.
IMPRESSION: Negative.

## 2014-11-04 IMAGING — CR DG SHOULDER 3+V*R*
1 series · 3 of 3 positions shown · non-contrast
Comparison: None.

CLINICAL DATA: Recent injury with pain

EXAM:
DG SHOULDER 3+ VIEWS RIGHT

[Series 1: axillary · 0.17mm/px · 3 of 3 slices shown]
[im 1/3]
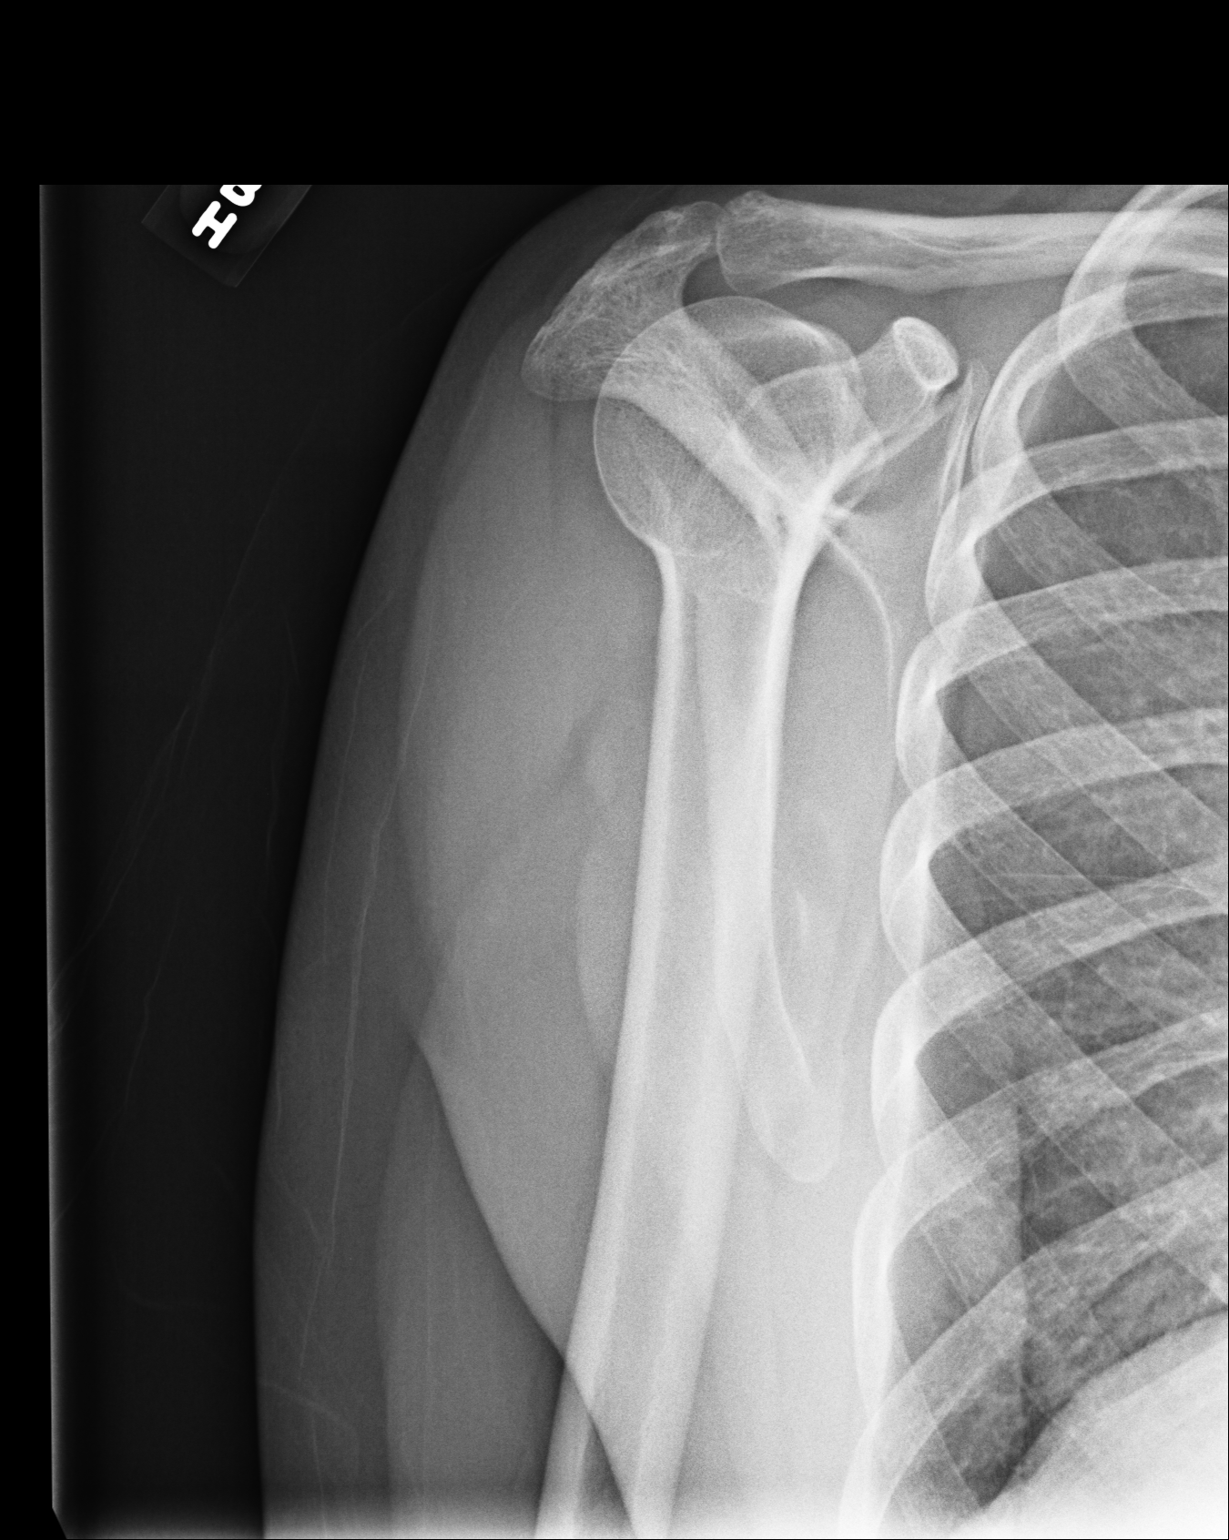
[im 2/3]
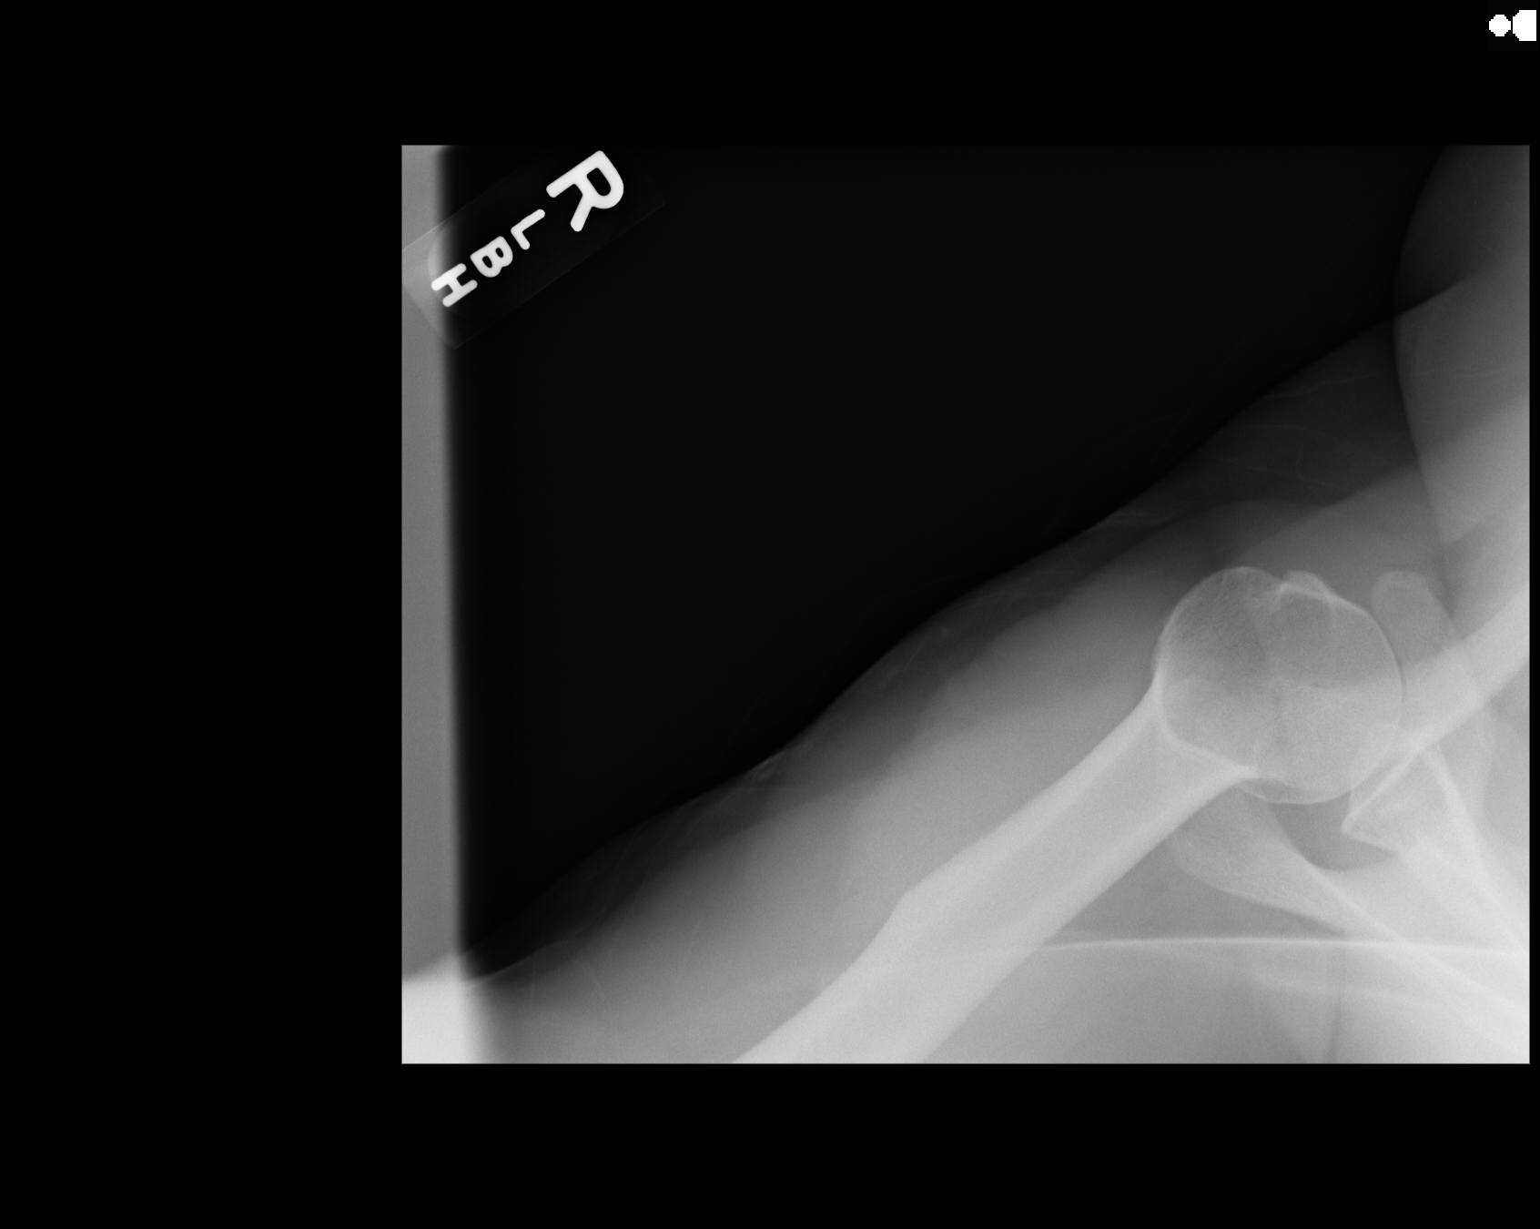
[im 3/3]
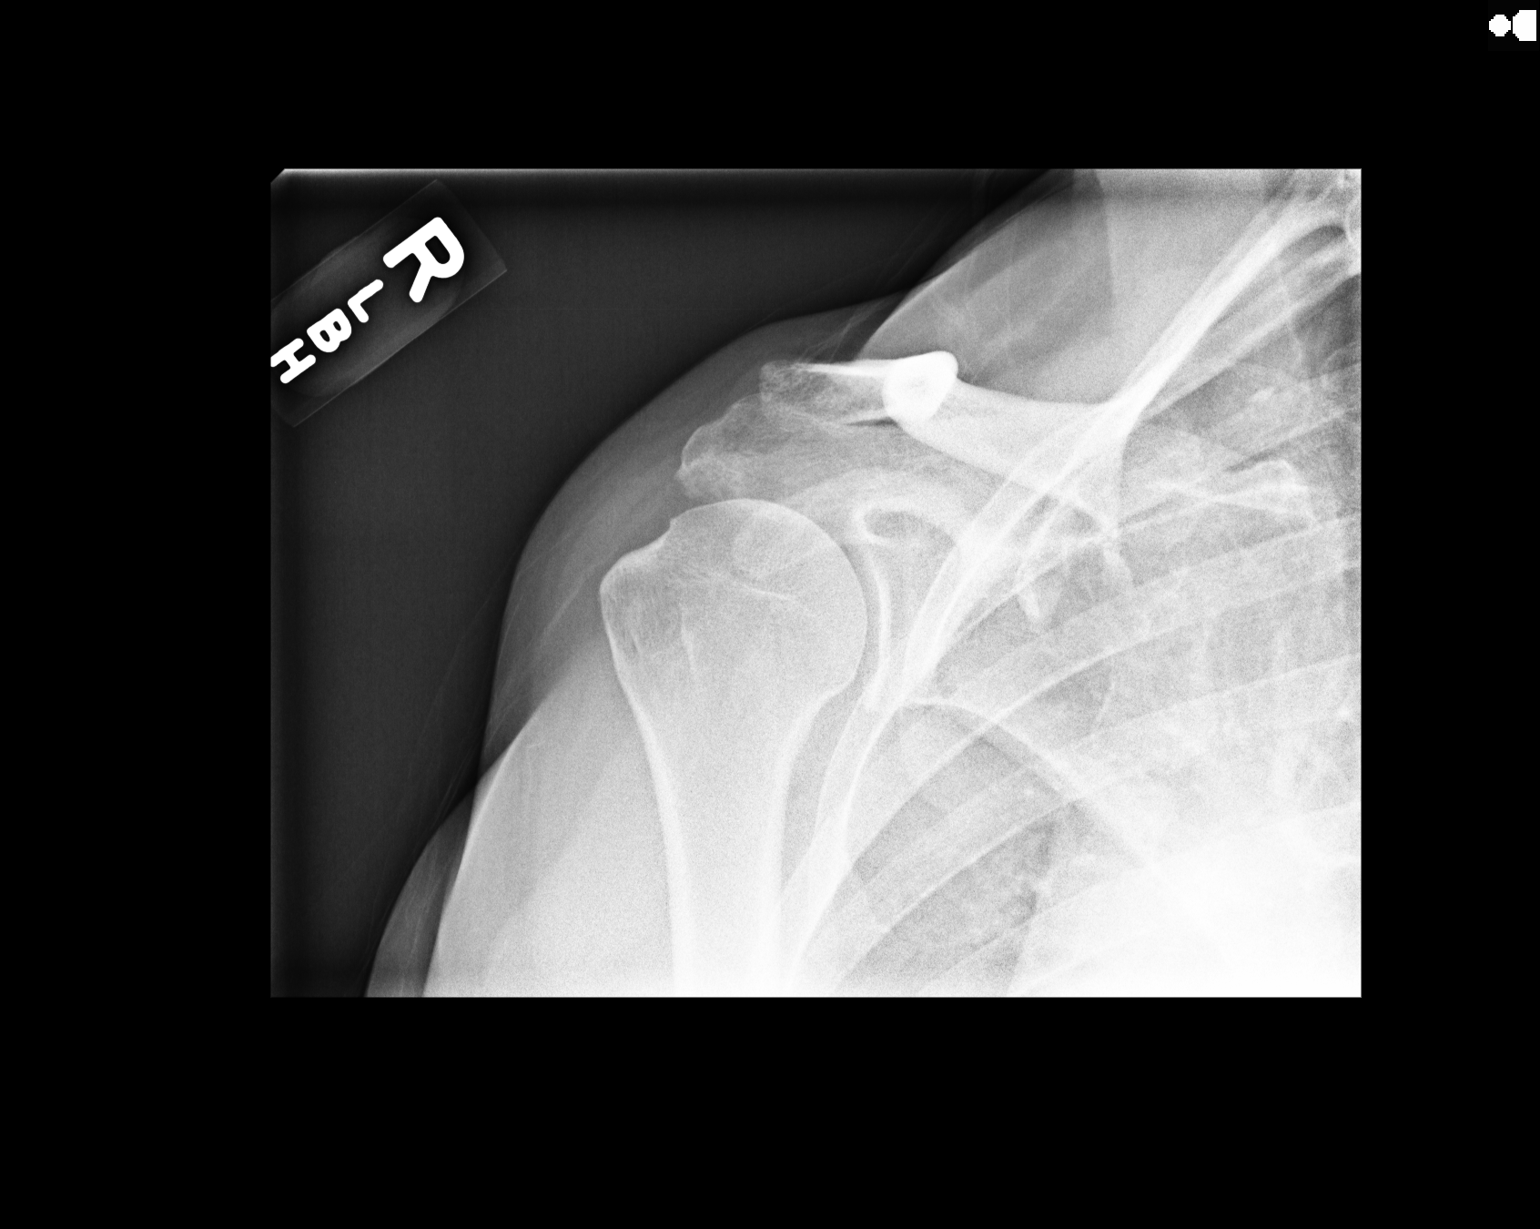

[3 of 3 positions shown; findings below may reference images not displayed]

FINDINGS: Degenerative changes of the acromioclavicular joint are noted. No
acute fracture or dislocation is seen. The underlying bony thorax is
within normal limits.
IMPRESSION: No acute abnormality noted.

## 2014-12-19 DIAGNOSIS — Z96659 Presence of unspecified artificial knee joint: Secondary | ICD-10-CM | POA: Insufficient documentation

## 2015-02-01 NOTE — Op Note (Signed)
PATIENT NAME:  Veronica Clayton, Veronica Clayton MR#:  [id number] DATE OF BIRTH:  [birth year]  DATE OF PROCEDURE:  01/16/2014  PREOPERATIVE DIAGNOSES: Right shoulder rotator cuff tear, subacromial impingement, acromioclavicular joint arthrosis and possible biceps tear versus tendinosis.  POSTOPERATIVE DIAGNOSES: Right shoulder rotator cuff tear, subacromial impingement, acromioclavicular joint arthrosis, biceps tendinosis with superior labral tear.  PROCEDURE: Right shoulder arthroscopic subacromial decompression, distal clavicle excision with mini open rotator cuff repair and biceps tenodesis.   ANESTHESIA: General with right interscalene block.   SURGEON: Juanell Fairly, M.D.   ESTIMATED BLOOD LOSS: Minimal.   COMPLICATIONS: None.   IMPLANTS: ArthroCare Magnum-2 anchors x 4 and Magnum-M anchors x 2.   INDICATIONS FOR PROCEDURE: The patient is a 59 year old female, who has had persistent right shoulder pain, which has been unresponsive to nonoperative management. An MRI has confirmed a full-thickness tear of the rotator cuff involving the supraspinatus. The patient elected to proceed with right shoulder rotator cuff tear repair. I had explained the risks and benefits of surgery with the patient prior to the date of surgery in my office. She understands the risks include infection, bleeding, nerve or blood vessel injury, especially injury to the axillary nerve, shoulder stiffness, persistent pain, re-tear of the rotator cuff, hardware failure and the need for further surgery. Medical risks include, but are not limited to DVT and pulmonary embolism, myocardial infarction, stroke, pneumonia, respiratory failure and death. The patient understood these risks and signed consent form in my office.   PROCEDURE NOTE: The patient was brought to the operating room. She underwent a right interscalene block by the anesthesia service and then underwent general endotracheal intubation. She was positioned in a beach chair  position for this case. All bony prominences were adequately padded. The patient was prepped and draped in a sterile fashion. A timeout was performed to verify the patient's name, date of birth, medical record number, correct site of surgery and correct procedure to be performed. It was also used to verify the patient has had received antibiotics and that all appropriate instruments, implants and radiographic studies were available in the room. Once all in attendance were in agreement, the case began.   The patient underwent examination under anesthesia, which revealed full range of motion and no instability. Bony landmarks were drawn out with a surgical marker along with planned arthroscopy incisions. These were pre-injected with 1% lidocaine plain. A #11 blade was used to establish a posterior portal through which the arthroscope was placed into the glenohumeral joint. The anterior portal was then established under direct visualization using an 18-gauge spinal needle for localization. A 5.75 mm cannula was placed through the anterior portal and a full diagnostic examination of the shoulder was undertaken.   Findings on arthroscopy included a tear of the superior labrum, biceps tendinosis with thickening and degenerative findings. There was a mild fraying of the superior aspect of the subscapularis without overt tear. The patient had a full-thickness rotator cuff tear involving the supraspinatus. The glenoid and humeral head surfaces were without significant chondral injury. There were no loose bodies seen. The anterior and posterior labrum was intact.   The patient had the biceps tendon release given the degree of tendinosis and the associated instability of the biceps anchor given the superior labral tear. A suture was placed in the biceps tendon prior to its release to maintain control of this for later tenodesis. The superior labrum was debrided using a 4.0 resector shaver blade.   The arthroscope was  then removed  and placed in the subacromial space. A lateral portal was created using an 18-gauge spinal needle for localization. An extensive bursectomy was performed to allow better visualization. A 5.5 mm resector shaver blade was then used to perform a subacromial decompression. It was then placed through the anterior portal and a distal clavicle excision was also performed. Two Opus Smart stitches were placed through the lateral portal into the lateral edge of the rotator cuff as well.   A saber-type incision was made along the lateral border of the right acromion. The deltoid muscle was then identified and split in line with its fibers. The Smart stitches were brought out through this deltoid split incision. A self-retaining shoulder retractor was placed to allow for visualization of the rotator cuff tear. A 5.5 mm resector shaver blade was used to debride the greater tuberosity from any remaining fibers of the rotator cuff. This was debrided until punctate bleeding was identified. Then two Magnum-M anchors were placed at the articular margin of the humeral head. The sutures from these 2 Magnum-M anchors were then passed through the medial portion of the rotator cuff using a first-pass suture passing device. These sutures were then clamped for later repair. A third Smart stitch was placed along the anterolateral edge of the rotator cuff tear for repair. The biceps tendon was then identified with its suture. An open biceps tenodesis was then performed placing the biceps tendon at the top of the anterior tuberous groove using Opus Magnum-2 anchors.   Three additional Magnum-2 anchors were then used to repair the lateral rotator cuff. This allowed excellent approximation of the rotator cuff to the greater tuberosity. Finally, the Magnum-M anchor sutures were tied down with a manual hand tying technique. All sutures were then cut. The wound was copiously irrigated. The rotator cuff was inspected and images with  the arthroscope camera were taken. The arm was rotated and the rotator cuff moved as a single unit. The rotator cuff laid flat against the greater tuberosity. Images from the glenohumeral joint were also taken of the rotator cuff repair using the arthroscope. Finally, all instruments were removed including the self-retaining shoulder retractor. The deltoid fascia was closed with interrupted 0 Vicryl, the subcutaneous tissue closed with 2-0 Vicryl and the skin of the saber incision was closed with a running 4-0 Monocryl. The 3 arthroscopic portals were closed with 4-0 nylon. Steri-Strips and dry sterile dressing were applied to the right arm along with TENS unit pads, a Polar Care sleeve and an abduction sling. The patient was then awakened and brought to the PACU in stable condition. I was scrubbed and present for the entire case, and all sharp and instrument counts were correct at the conclusion of the case. I spoke with the patient's husband in the postoperative consultation room to let him know the case had gone without complication and the patient was stable in the recovery room.    ____________________________ Kathreen DevoidKevin L. Tayvian Holycross, MD klk:aw D: 01/21/2014 07:39:33 ET T: 01/21/2014 07:56:40 ET JOB#: [id number]  cc: Kathreen DevoidKevin L. Delisa Finck, MD, <Dictator> Kathreen DevoidKEVIN L Aprill Banko MD ELECTRONICALLY SIGNED 01/29/2014 15:31

## 2015-03-17 ENCOUNTER — Other Ambulatory Visit: Payer: Self-pay | Admitting: Internal Medicine

## 2015-04-23 ENCOUNTER — Ambulatory Visit
Admission: EM | Admit: 2015-04-23 | Discharge: 2015-04-23 | Disposition: A | Discharge status: Home / Self Care | Payer: 59 | Attending: Internal Medicine | Admitting: Internal Medicine

## 2015-04-23 DIAGNOSIS — F329 Major depressive disorder, single episode, unspecified: Secondary | ICD-10-CM | POA: Diagnosis not present

## 2015-04-23 DIAGNOSIS — R319 Hematuria, unspecified: Secondary | ICD-10-CM | POA: Diagnosis not present

## 2015-04-23 DIAGNOSIS — K219 Gastro-esophageal reflux disease without esophagitis: Secondary | ICD-10-CM | POA: Insufficient documentation

## 2015-04-23 DIAGNOSIS — Z87891 Personal history of nicotine dependence: Secondary | ICD-10-CM | POA: Diagnosis not present

## 2015-04-23 DIAGNOSIS — J45909 Unspecified asthma, uncomplicated: Secondary | ICD-10-CM | POA: Insufficient documentation

## 2015-04-23 DIAGNOSIS — R310 Gross hematuria: Secondary | ICD-10-CM | POA: Insufficient documentation

## 2015-04-23 DIAGNOSIS — R81 Glycosuria: Secondary | ICD-10-CM | POA: Diagnosis not present

## 2015-04-23 DIAGNOSIS — Z7982 Long term (current) use of aspirin: Secondary | ICD-10-CM | POA: Diagnosis not present

## 2015-04-23 DIAGNOSIS — R350 Frequency of micturition: Secondary | ICD-10-CM | POA: Diagnosis present

## 2015-04-23 LAB — URINALYSIS COMPLETE WITH MICROSCOPIC (ARMC ONLY)
Bilirubin Urine: NEGATIVE
Glucose, UA: 100 mg/dL — AB
KETONES UR: NEGATIVE mg/dL
LEUKOCYTES UA: NEGATIVE
Nitrite: NEGATIVE
Protein, ur: 30 mg/dL — AB
SPECIFIC GRAVITY, URINE: 1.025 (ref 1.005–1.030)
Squamous Epithelial / LPF: NONE SEEN — AB
WBC UA: NONE SEEN WBC/hpf (ref <3)
pH: 5 (ref 5.0–8.0)

## 2015-04-23 MED ORDER — NITROFURANTOIN MONOHYD MACRO 100 MG PO CAPS: 100.0000 mg | ORAL_CAPSULE | Freq: Two times a day (BID) | ORAL | Status: DC

## 2015-04-23 NOTE — Discharge Instructions (Signed)

## 2015-04-23 NOTE — ED Notes (Signed)
Pt states "I have had urinary frequency since morning, maybe a little last night and maybe a little low back pain, but I always have that." Denies fever or chills.

## 2015-04-23 NOTE — ED Provider Notes (Signed)
CSN: [id number]     Arrival date & time 04/23/15  1630 History   First MD Initiated Contact with Patient 04/23/15 1722     Chief Complaint  Patient presents with  . Urinary Frequency   HPI Patient is a 59 year old lady with history of small amount of hematuria, not thought to be significant in the past. She reports an odor to the urine in the last couple weeks, and today noticed a pink discoloration to the urine, which progressed to frank hematuria. She has had maybe some slight pelvic discomfort.  Dysuria; equivocal frequency today. No flank pain. She does have chronic intermittent right low back discomfort, doesn't think it's different than usual today. Nausea/vomiting, no chills, no fever. Bowels have been a little looser than usual for the last 2 days, that she's been eating a lot of fresh produce from the garden. No vaginal discharge or bleeding and she has had kidney stones in the past. She does have fatigue, this is not new.    Past Medical History  Diagnosis Date  . Dysrhythmia dx 2008    tachycardia  . Asthma     bronchial asthma-no issue recently  . Arthritis   . Depression   . Herpes simplex without mention of complication   . Unspecified hearing loss   . Depressive disorder, not elsewhere classified   . Insomnia, unspecified   . GERD (gastroesophageal reflux disease)    Past Surgical History  Procedure Laterality Date  . Abdominal hysterectomy  1985  . Joint replacement  2005/2006    knee-Rt partial/ Lt-total  . Tonsillectomy    . Tonsillectomy and adenoidectomy  as child  . Carpometacarpal (cmc) fusion of thumb  11/02/2012    Procedure: CARPOMETACARPAL (CMC) FUSION OF THUMB;  Surgeon: Dominica SeverinWilliam Gramig, MD;  Location: [facility];  Service: Orthopedics;  Laterality: Left;  LEFT CMC ARTHROPLASTY WITH DOUBLE TENDON TRANSFER AND REPAIR RECONSTRUCTION AS NECCESSARY   Family History  Problem Relation Age of Onset  . Scleroderma Father   . Colon cancer Father    . Hypertension Mother   . Hyperlipidemia Mother    History  Substance Use Topics  . Smoking status: Former Smoker -- 1.00 packs/day for 20 years    Types: Cigarettes    Quit date: 10/31/2007  . Smokeless tobacco: Never Used  . Alcohol Use: Yes     Comment: occ beer    Review of Systems  All other systems reviewed and are negative.   Allergies  Tetracyclines & related and Tape  Home Medications   Prior to Admission medications   Medication Sig Start Date End Date Taking? Authorizing Provider  citalopram (CELEXA) 40 MG tablet Take 40 mg by mouth daily.   Yes Historical Provider, MD  estradiol (ESTRACE) 1 MG tablet Take 1 mg by mouth 2 (two) times daily.    Yes Historical Provider, MD  metoprolol succinate (TOPROL-XL) 50 MG 24 hr tablet Take 50 mg by mouth daily. Take with or immediately following a meal.   Yes Historical Provider, MD  Multiple Vitamins-Minerals (PRESERVISION/LUTEIN) CAPS Take 1 capsule by mouth daily.   Yes Historical Provider, MD  pantoprazole (PROTONIX) 40 MG tablet Take 40 mg by mouth daily.   Yes Historical Provider, MD  zolpidem (AMBIEN) 10 MG tablet TAKE ONE TABLET BY MOUTH AT BEDTIME 03/17/15  Yes Reubin MilanLaura H Berglund, MD  acetaminophen (TYLENOL) 500 MG tablet Take 1,000 mg by mouth every 6 (six) hours as needed.    Historical Provider, MD  albuterol (PROVENTIL HFA;VENTOLIN HFA) 108 (90 BASE) MCG/ACT inhaler Inhale 2 puffs into the lungs daily as needed.    Historical Provider, MD  aspirin 81 MG tablet Take 81 mg by mouth daily.    Historical Provider, MD  ibuprofen (ADVIL,MOTRIN) 800 MG tablet Take 800 mg by mouth at bedtime.    Historical Provider, MD  BP 126/73 mmHg  Pulse 64  Temp(Src) 97.5 F (36.4 C) (Tympanic)  Resp 16  SpO2 99% Physical Exam  Constitutional: She is oriented to person, place, and time. No distress.  Alert, nicely groomed  HENT:  Head: Atraumatic.  Eyes:  Conjugate gaze, no eye redness/drainage  Neck: Neck supple.   Cardiovascular: Normal rate and regular rhythm.   Pulmonary/Chest: No respiratory distress.  Lungs clear, symmetric breath sounds  Abdominal: Soft. She exhibits no distension. There is no rebound and no guarding.  Mild suprapubic tenderness to deep palpation  Musculoskeletal: Normal range of motion.  No leg swelling  Neurological: She is alert and oriented to person, place, and time.  Skin: Skin is warm and dry.  No cyanosis  Nursing note and vitals reviewed.   ED Course  Procedures   Results for orders placed or performed during the hospital encounter of 04/23/15  Urinalysis complete, with microscopic  Result Value Ref Range   Color, Urine AMBER (A) YELLOW   APPearance CLOUDY (A) CLEAR   Glucose, UA 100 (A) NEGATIVE mg/dL   Bilirubin Urine NEGATIVE NEGATIVE   Ketones, ur NEGATIVE NEGATIVE mg/dL   Specific Gravity, Urine 1.025 1.005 - 1.030   Hgb urine dipstick 3+ (A) NEGATIVE   pH 5.0 5.0 - 8.0   Protein, ur 30 (A) NEGATIVE mg/dL   Nitrite NEGATIVE NEGATIVE   Leukocytes, UA NEGATIVE NEGATIVE   RBC / HPF TOO NUMEROUS TO COUNT <3 RBC/hpf   WBC, UA NONE SEEN <3 WBC/hpf   Bacteria, UA FEW (A) RARE   Squamous Epithelial / LPF NONE SEEN (A) RARE   Urine cx pending.  MDM   1. Hematuria   2. Glucosuria    Possible UTI, vs stone. Rx for macrobid. Recheck UA in 10 days, for blood, glucose.    Eustace Moore, MD 04/23/15 [phone]

## 2015-04-25 LAB — URINE CULTURE: SPECIAL REQUESTS: NORMAL

## 2015-06-12 ENCOUNTER — Encounter: Payer: Self-pay | Admitting: Internal Medicine

## 2015-06-12 ENCOUNTER — Ambulatory Visit (INDEPENDENT_AMBULATORY_CARE_PROVIDER_SITE_OTHER): Payer: 59 | Admitting: Internal Medicine

## 2015-06-12 VITALS — BP 110/72 | HR 68 | Temp 98.4°F | Ht 66.0 in | Wt 176.6 lb

## 2015-06-12 DIAGNOSIS — J029 Acute pharyngitis, unspecified: Secondary | ICD-10-CM

## 2015-06-12 DIAGNOSIS — H109 Unspecified conjunctivitis: Secondary | ICD-10-CM | POA: Diagnosis not present

## 2015-06-12 LAB — POCT RAPID STREP A (OFFICE): RAPID STREP A SCREEN: NEGATIVE

## 2015-06-12 MED ORDER — AMOXICILLIN 875 MG PO TABS: 875.0000 mg | ORAL_TABLET | Freq: Two times a day (BID) | ORAL | Status: DC

## 2015-06-12 MED ORDER — NEOMYCIN-POLYMYXIN-DEXAMETH 3.5-10000-0.1 OP SUSP: 2.0000 [drp] | Freq: Four times a day (QID) | OPHTHALMIC | Status: DC

## 2015-06-12 NOTE — Progress Notes (Signed)
Date:  06/12/2015   Name:  Marketia Stallsmith   DOB:  Sep 07, 1956   MRN:  [id number]   Chief Complaint: Sore Throat  patient reports onset of sore throat about 5 days ago. Initial symptoms were more like upper respiratory head cold now moving down into her chest. Yesterday she had fairly significant ear discomfort as well. 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 She also reports some irritation of her left eye with a gritty feeling however no excess tearing or crusting.  Review of Systems:  Review of Systems  Patient Active Problem List   Diagnosis Date Noted  . 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 Palpitations 07/20/2013  . Hyperlipidemia 07/20/2013    Prior to Admission medications   Medication Sig Start Date End Date Taking? Authorizing Provider  acetaminophen (TYLENOL) 500 MG tablet Take 1,000 mg by mouth every 6 (six) hours as needed.   Yes Historical Provider, MD  albuterol (PROVENTIL HFA;VENTOLIN HFA) 108 (90 BASE) MCG/ACT inhaler Inhale 2 puffs into the lungs daily as needed.   Yes Historical Provider, MD  aspirin 81 MG tablet Take 81 mg by mouth daily.   Yes Historical Provider, MD  citalopram (CELEXA) 40 MG tablet Take 40 mg by mouth daily.   Yes Historical Provider, MD  ibuprofen (ADVIL,MOTRIN) 800 MG tablet Take 800 mg by mouth at bedtime.   Yes Historical Provider, MD  metoprolol succinate (TOPROL-XL) 50 MG 24 hr tablet Take 50 mg by mouth daily. Take with or immediately following a meal.   Yes Historical Provider, MD  pantoprazole (PROTONIX) 40 MG tablet Take 40 mg by mouth daily.   Yes Historical Provider, MD  zolpidem (AMBIEN) 10 MG tablet TAKE ONE TABLET BY MOUTH AT BEDTIME 03/17/15  Yes Reubin Milan, MD    Allergies  Allergen Reactions  . Tetracyclines & Related Hives  . Tape Itching    Plastic tape and bandaids causes redness and itching    Past Surgical History  Procedure Laterality Date  . Abdominal hysterectomy  1985   . Joint replacement  2005/2006    knee-Rt partial/ Lt-total  . Tonsillectomy    . Tonsillectomy and adenoidectomy  as child  . Carpometacarpal (cmc) fusion of thumb  11/02/2012    Procedure: CARPOMETACARPAL (CMC) FUSION OF THUMB;  Surgeon: Dominica Severin, MD;  Location: [facility];  Service: Orthopedics;  Laterality: Left;  LEFT CMC ARTHROPLASTY WITH DOUBLE TENDON TRANSFER AND REPAIR RECONSTRUCTION AS NECCESSARY    Social History  Substance Use Topics  . Smoking status: Former Smoker -- 1.00 packs/day for 20 years    Types: Cigarettes    Quit date: 10/31/2007  . Smokeless tobacco: Never Used  . Alcohol Use: Yes     Comment: occ beer     Medication list has been reviewed and updated.  Physical Examination:  Physical Exam  Constitutional: She appears well-developed and well-nourished.  HENT:  Right Ear: Tympanic membrane is retracted. Tympanic membrane is not erythematous.  Left Ear: Tympanic membrane is retracted. Tympanic membrane is not erythematous.  Eyes: Left eye exhibits chemosis. Left eye exhibits no discharge and no exudate.  Neck: Carotid bruit is not present.  Cardiovascular: Normal rate, regular rhythm and S1 normal.   Pulmonary/Chest: Effort normal and breath sounds normal.  Lymphadenopathy:       Head (right side): Submandibular adenopathy present.       Head (left side): Submandibular adenopathy present.    She  has cervical adenopathy.  Nursing note and vitals reviewed.   BP 110/72 mmHg  Pulse 68  Temp(Src) 98.4 F (36.9 C)  Ht 5\' 6"  (1.676 m)  Wt 176 lb 9.6 oz (80.105 kg)  BMI 28.52 kg/m2  SpO2 98%  Assessment and Plan: 1. Pharyngitis Strep test negative - POCT rapid strep A - amoxicillin (AMOXIL) 875 MG tablet; Take 1 tablet (875 mg total) by mouth 2 (two) times daily.  Dispense: 20 tablet; Refill: 0  2. Conjunctivitis of left eye - neomycin-polymyxin b-dexamethasone (MAXITROL) 3.5-10000-0.1 SUSP; Place 2 drops into both eyes every  6 (six) hours.  Dispense: 5 mL; Refill: 0   Bari Edward, MD [facility]  06/12/2015

## 2015-06-26 ENCOUNTER — Other Ambulatory Visit: Payer: Self-pay | Admitting: Internal Medicine

## 2015-06-26 DIAGNOSIS — Z1231 Encounter for screening mammogram for malignant neoplasm of breast: Secondary | ICD-10-CM

## 2015-07-01 ENCOUNTER — Encounter: Payer: Self-pay | Admitting: Internal Medicine

## 2015-07-01 ENCOUNTER — Ambulatory Visit (INDEPENDENT_AMBULATORY_CARE_PROVIDER_SITE_OTHER): Payer: 59 | Admitting: Internal Medicine

## 2015-07-01 VITALS — BP 120/80 | HR 72 | Temp 98.1°F | Ht 66.0 in | Wt 175.0 lb

## 2015-07-01 DIAGNOSIS — F324 Major depressive disorder, single episode, in partial remission: Secondary | ICD-10-CM | POA: Insufficient documentation

## 2015-07-01 DIAGNOSIS — J40 Bronchitis, not specified as acute or chronic: Secondary | ICD-10-CM | POA: Diagnosis not present

## 2015-07-01 DIAGNOSIS — K219 Gastro-esophageal reflux disease without esophagitis: Secondary | ICD-10-CM | POA: Insufficient documentation

## 2015-07-01 DIAGNOSIS — Z792 Long term (current) use of antibiotics: Secondary | ICD-10-CM | POA: Insufficient documentation

## 2015-07-01 DIAGNOSIS — G4700 Insomnia, unspecified: Secondary | ICD-10-CM | POA: Insufficient documentation

## 2015-07-01 DIAGNOSIS — B009 Herpesviral infection, unspecified: Secondary | ICD-10-CM | POA: Insufficient documentation

## 2015-07-01 DIAGNOSIS — Z86010 Personal history of colonic polyps: Secondary | ICD-10-CM | POA: Insufficient documentation

## 2015-07-01 MED ORDER — LEVOFLOXACIN 500 MG PO TABS: 500.0000 mg | ORAL_TABLET | Freq: Every day | ORAL | Status: DC

## 2015-07-01 MED ORDER — HYDROCODONE-HOMATROPINE 5-1.5 MG/5ML PO SYRP: 5.0000 mL | ORAL_SOLUTION | Freq: Four times a day (QID) | ORAL | Status: DC | PRN

## 2015-07-01 NOTE — Progress Notes (Signed)
Date:  07/01/2015   Name:  Veronica Clayton   DOB:  May 28, 1956   MRN:  [id number]   Chief Complaint: Sinusitis and Cough Sinusitis This is a new problem. The current episode started in the past 7 days. The problem has been gradually worsening since onset. Associated symptoms include chills, coughing and shortness of breath. Pertinent negatives include no sinus pressure or sore throat.  Cough Associated symptoms include chills, a fever, postnasal drip, shortness of breath and wheezing. Pertinent negatives include no chest pain or sore throat.   Review of Systems:  Review of Systems  Constitutional: Positive for fever, chills and fatigue.  HENT: Positive for postnasal drip. Negative for hearing loss, nosebleeds, sinus pressure and sore throat.   Respiratory: Positive for cough, shortness of breath and wheezing.   Cardiovascular: Negative for chest pain and palpitations.  Neurological: Negative for dizziness, syncope and light-headedness.    Patient Active Problem List   Diagnosis Date Noted  . Insomnia 07/01/2015  . Acid reflux disease 07/01/2015  . Depression, major, in partial remission 07/01/2015  . Herpes simplex 07/01/2015  . Hx of colonic polyp 07/01/2015  . Need for prophylactic antibiotic 07/01/2015  . 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 Palpitations 07/20/2013  . Hyperlipidemia 07/20/2013    Prior to Admission medications   Medication Sig Start Date End Date Taking? Authorizing Provider  acetaminophen (TYLENOL) 500 MG tablet Take 1,000 mg by mouth every 6 (six) hours as needed.   Yes Historical Provider, MD  albuterol (PROVENTIL HFA;VENTOLIN HFA) 108 (90 BASE) MCG/ACT inhaler Inhale 2 puffs into the lungs daily as needed.   Yes Historical Provider, MD  aspirin 81 MG tablet Take 81 mg by mouth daily.   Yes Historical Provider, MD  citalopram (CELEXA) 40 MG tablet Take 40 mg by mouth daily.   Yes Historical Provider, MD  ibuprofen (ADVIL,MOTRIN) 800 MG tablet Take 800  mg by mouth at bedtime.   Yes Historical Provider, MD  metoprolol succinate (TOPROL-XL) 50 MG 24 hr tablet Take 50 mg by mouth daily. Take with or immediately following a meal.   Yes Historical Provider, MD  neomycin-polymyxin b-dexamethasone (MAXITROL) 3.5-10000-0.1 SUSP Place 2 drops into both eyes every 6 (six) hours. 06/12/15  Yes Reubin Milan, MD  pantoprazole (PROTONIX) 40 MG tablet Take 40 mg by mouth daily.   Yes Historical Provider, MD  zolpidem (AMBIEN) 10 MG tablet TAKE ONE TABLET BY MOUTH AT BEDTIME 03/17/15  Yes Reubin Milan, MD    Allergies  Allergen Reactions  . Tetracyclines & Related Hives  . Tape Itching    Plastic tape and bandaids causes redness and itching    Past Surgical History  Procedure Laterality Date  . Abdominal hysterectomy  1985  . Joint replacement  2005/2006    knee-Rt partial/ Lt-total  . Tonsillectomy and adenoidectomy  as child  . Carpometacarpal (cmc) fusion of thumb  11/02/2012    Procedure: CARPOMETACARPAL (CMC) FUSION OF THUMB;  Surgeon: Dominica Severin, MD;  Location: [facility];  Service: Orthopedics;  Laterality: Left;  LEFT CMC ARTHROPLASTY WITH DOUBLE TENDON TRANSFER AND REPAIR RECONSTRUCTION AS NECCESSARY  . Esophagogastroduodenoscopy  2012    Gastritis, duodenitis    Social History  Substance Use Topics  . Smoking status: Former Smoker -- 1.00 packs/day for 20 years    Types: Cigarettes    Quit date: 10/31/2007  . Smokeless tobacco: Never Used  . Alcohol Use: 0.0 oz/week    0 Standard drinks  or equivalent per week     Comment: occ beer     Medication list has been reviewed and updated.  Physical Examination:  Physical Exam  Constitutional: She is oriented to person, place, and time. She appears well-developed. No distress.  HENT:  Head: Normocephalic and atraumatic.  Right Ear: Tympanic membrane and ear canal normal.  Left Ear: Tympanic membrane and ear canal normal.  Nose: Right sinus exhibits no  maxillary sinus tenderness and no frontal sinus tenderness. Left sinus exhibits no maxillary sinus tenderness and no frontal sinus tenderness.  Mouth/Throat: Uvula is midline and oropharynx is clear and moist.  Eyes: Conjunctivae are normal. Right eye exhibits no discharge. Left eye exhibits no discharge. No scleral icterus.  Cardiovascular: Normal rate, regular rhythm and normal heart sounds.   Pulmonary/Chest: Effort normal. No respiratory distress. She has rhonchi in the right upper field.  Musculoskeletal: Normal range of motion.  Neurological: She is alert and oriented to person, place, and time.  Skin: Skin is warm and dry. No rash noted.  Psychiatric: She has a normal mood and affect. Her behavior is normal. Thought content normal.    BP 120/80 mmHg  Pulse 72  Temp(Src) 98.1 F (36.7 C)  Ht  (1.676 m)  Wt 175 lb (79.379 kg)  BMI 28.26 kg/m2  SpO2 92%  Assessment and Plan: 1. Bronchitis - levofloxacin (LEVAQUIN) 500 MG tablet; Take 1 tablet (500 mg total) by mouth daily.  Dispense: 7 tablet; Refill: 0 - HYDROcodone-homatropine (HYCODAN) 5-1.5 MG/5ML syrup; Take 5 mLs by mouth every 6 (six) hours as needed for cough.  Dispense: 120 mL; Refill: 0  Bari Edward

## 2015-07-11 ENCOUNTER — Ambulatory Visit
Admission: RE | Admit: 2015-07-11 | Discharge: 2015-07-11 | Disposition: A | Discharge status: Home / Self Care | Payer: 59 | Source: Ambulatory Visit | Attending: Internal Medicine | Admitting: Internal Medicine

## 2015-07-11 ENCOUNTER — Other Ambulatory Visit: Payer: Self-pay | Admitting: Internal Medicine

## 2015-07-11 DIAGNOSIS — Z1231 Encounter for screening mammogram for malignant neoplasm of breast: Secondary | ICD-10-CM | POA: Insufficient documentation

## 2015-09-01 ENCOUNTER — Other Ambulatory Visit: Payer: Self-pay | Admitting: Internal Medicine

## 2015-09-02 ENCOUNTER — Other Ambulatory Visit: Payer: Self-pay

## 2015-09-02 MED ORDER — CITALOPRAM HYDROBROMIDE 40 MG PO TABS: 40.0000 mg | ORAL_TABLET | Freq: Every day | ORAL | Status: DC

## 2015-09-02 MED ORDER — PANTOPRAZOLE SODIUM 40 MG PO TBEC: 40.0000 mg | DELAYED_RELEASE_TABLET | Freq: Every day | ORAL | Status: DC

## 2015-09-15 ENCOUNTER — Ambulatory Visit (INDEPENDENT_AMBULATORY_CARE_PROVIDER_SITE_OTHER): Payer: 59 | Admitting: Internal Medicine

## 2015-09-15 ENCOUNTER — Encounter: Payer: Self-pay | Admitting: Internal Medicine

## 2015-09-15 VITALS — BP 102/62 | HR 64 | Ht 66.0 in | Wt 176.4 lb

## 2015-09-15 DIAGNOSIS — F324 Major depressive disorder, single episode, in partial remission: Secondary | ICD-10-CM | POA: Diagnosis not present

## 2015-09-15 DIAGNOSIS — R002 Palpitations: Secondary | ICD-10-CM

## 2015-09-15 DIAGNOSIS — K219 Gastro-esophageal reflux disease without esophagitis: Secondary | ICD-10-CM

## 2015-09-15 DIAGNOSIS — E785 Hyperlipidemia, unspecified: Secondary | ICD-10-CM | POA: Diagnosis not present

## 2015-09-15 DIAGNOSIS — G4700 Insomnia, unspecified: Secondary | ICD-10-CM | POA: Diagnosis not present

## 2015-09-15 DIAGNOSIS — Z0000 Encounter for general adult medical examination without abnormal findings: Secondary | ICD-10-CM

## 2015-09-15 DIAGNOSIS — Z1159 Encounter for screening for other viral diseases: Secondary | ICD-10-CM

## 2015-09-15 LAB — POCT URINALYSIS DIPSTICK
Bilirubin, UA: NEGATIVE
GLUCOSE UA: NEGATIVE
KETONES UA: NEGATIVE
Leukocytes, UA: NEGATIVE
Nitrite, UA: NEGATIVE
Protein, UA: NEGATIVE
RBC UA: NEGATIVE
SPEC GRAV UA: 1.02
UROBILINOGEN UA: 0.2
pH, UA: 5

## 2015-09-15 MED ORDER — METOPROLOL SUCCINATE ER 50 MG PO TB24: 50.0000 mg | ORAL_TABLET | Freq: Every day | ORAL | Status: DC

## 2015-09-15 MED ORDER — ZOLPIDEM TARTRATE 10 MG PO TABS: 10.0000 mg | ORAL_TABLET | Freq: Every day | ORAL | Status: DC

## 2015-09-15 NOTE — Progress Notes (Signed)
Date:  09/15/2015   Name:  Veronica Clayton   DOB:  [birth year]   MRN:  [id number]   Chief Complaint: Annual Exam; Gastroesophageal Reflux; and Depression Veronica Clayton is a 59 y.o. female who presents today for her Complete Annual Exam. She feels fairly well. She reports exercising none. She reports she is sleeping fairly well on ambien.   Gastroesophageal Reflux She reports no chest pain, no coughing, no sore throat or no wheezing. This is a chronic problem. The current episode started more than 1 year ago. The problem occurs rarely. Pertinent negatives include no fatigue. There are no known risk factors. She has tried a PPI for the symptoms. The treatment provided significant relief. Past procedures include an EGD.  Depression        This is a chronic problem.  The problem occurs rarely.  The problem has been resolved since onset.  Associated symptoms include insomnia.  Associated symptoms include no decreased concentration, no fatigue, no decreased interest, no headaches, not sad and no suicidal ideas.     The symptoms are aggravated by work stress.  Past treatments include SSRIs - Selective serotonin reuptake inhibitors.  Compliance with treatment is good.  Previous treatment provided significant relief. Palpitations  This is a chronic problem. The current episode started more than 1 year ago. The problem occurs rarely. The problem has been unchanged. Pertinent negatives include no anxiety, chest pain, coughing, diaphoresis, dizziness, fever, shortness of breath or weakness. She has tried beta blockers for the symptoms. The treatment provided significant relief. There is no history of anxiety, drug use, heart disease or hyperthyroidism.     Review of Systems  Constitutional: Negative for fever, chills, diaphoresis and fatigue.  HENT: Negative for hearing loss, sore throat, tinnitus and trouble swallowing.   Eyes: Negative for visual disturbance.  Respiratory: Negative for cough, chest  tightness, shortness of breath and wheezing.   Cardiovascular: Positive for palpitations. Negative for chest pain and leg swelling.  Gastrointestinal: Negative for diarrhea, constipation and blood in stool.  Endocrine: Negative for polydipsia and polyuria.  Genitourinary: Positive for hematuria (seen on UA in July - none visible). Negative for dysuria, decreased urine volume, vaginal bleeding and vaginal discharge.  Musculoskeletal: Positive for arthralgias. Negative for back pain, joint swelling, gait problem and neck pain.  Skin: Negative for color change and rash.  Neurological: Negative for dizziness, tremors, facial asymmetry, weakness and headaches.  Hematological: Negative for adenopathy. Does not bruise/bleed easily.  Psychiatric/Behavioral: Positive for depression and sleep disturbance. Negative for suicidal ideas, dysphoric mood and decreased concentration. The patient has insomnia. The patient is not nervous/anxious.     Patient Active Problem List   Diagnosis Date Noted  . Insomnia 07/01/2015  . Acid reflux disease 07/01/2015  . Depression, major, in partial remission (HCC) 07/01/2015  . Herpes simplex 07/01/2015  . Hx of colonic polyp 07/01/2015  . Need for prophylactic antibiotic 07/01/2015  . 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 Palpitations 07/20/2013  . Hyperlipidemia 07/20/2013    Prior to Admission medications   Medication Sig Start Date End Date Taking? Authorizing Provider  acetaminophen (TYLENOL) 500 MG tablet Take 1,000 mg by mouth every 6 (six) hours as needed.   Yes Historical Provider, MD  albuterol (PROVENTIL HFA;VENTOLIN HFA) 108 (90 BASE) MCG/ACT inhaler Inhale 2 puffs into the lungs daily as needed.   Yes Historical Provider, MD  aspirin 81 MG tablet Take 81 mg by mouth daily.   Yes Historical Provider,  MD  citalopram (CELEXA) 40 MG tablet Take 1 tablet (40 mg total) by mouth daily. 09/02/15  Yes Reubin Milan, MD  ibuprofen (ADVIL,MOTRIN) 800 MG  tablet Take 800 mg by mouth at bedtime.   Yes Historical Provider, MD  metoprolol succinate (TOPROL-XL) 50 MG 24 hr tablet Take 50 mg by mouth daily. Take with or immediately following a meal.   Yes Historical Provider, MD  pantoprazole (PROTONIX) 40 MG tablet Take 1 tablet (40 mg total) by mouth daily. 09/02/15  Yes Reubin Milan, MD  zolpidem (AMBIEN) 10 MG tablet TAKE ONE TABLET BY MOUTH AT BEDTIME 03/17/15  Yes Reubin Milan, MD    Allergies  Allergen Reactions  . Tetracyclines & Related Hives  . Tape Itching    Plastic tape and bandaids causes redness and itching    Past Surgical History  Procedure Laterality Date  . Abdominal hysterectomy  1985  . Joint replacement  2005/2006    knee-Rt partial/ Lt-total  . Tonsillectomy and adenoidectomy  as child  . Carpometacarpal (cmc) fusion of thumb  11/02/2012    Procedure: CARPOMETACARPAL (CMC) FUSION OF THUMB;  Surgeon: Dominica Severin, MD;  Location: [facility];  Service: Orthopedics;  Laterality: Left;  LEFT CMC ARTHROPLASTY WITH DOUBLE TENDON TRANSFER AND REPAIR RECONSTRUCTION AS NECCESSARY  . Esophagogastroduodenoscopy  2012    Gastritis, duodenitis  . Augmentation mammaplasty Bilateral     Removed 10/12/2010    Social History  Substance Use Topics  . Smoking status: Former Smoker -- 1.00 packs/day for 20 years    Types: Cigarettes    Quit date: 10/31/2007  . Smokeless tobacco: Never Used  . Alcohol Use: 0.0 oz/week    0 Standard drinks or equivalent per week     Comment: occ beer    Medication list has been reviewed and updated.   Physical Exam  Constitutional: She is oriented to person, place, and time. She appears well-developed and well-nourished. No distress.  HENT:  Head: Normocephalic and atraumatic.  Right Ear: Tympanic membrane and ear canal normal.  Left Ear: Tympanic membrane and ear canal normal.  Nose: Right sinus exhibits no maxillary sinus tenderness. Left sinus exhibits no maxillary  sinus tenderness.  Mouth/Throat: Uvula is midline and oropharynx is clear and moist.  Eyes: Conjunctivae and EOM are normal. Right eye exhibits no discharge. Left eye exhibits no discharge. No scleral icterus.  Neck: Normal range of motion. Carotid bruit is not present. No erythema present. No thyromegaly present.  Cardiovascular: Normal rate, regular rhythm, normal heart sounds and normal pulses.   Pulmonary/Chest: Effort normal. No respiratory distress. She has no wheezes. Right breast exhibits inverted nipple. Right breast exhibits no mass, no nipple discharge, no skin change and no tenderness. Left breast exhibits inverted nipple. Left breast exhibits no mass, no nipple discharge, no skin change and no tenderness.  Abdominal: Soft. Bowel sounds are normal. There is no hepatosplenomegaly. There is no tenderness. There is no CVA tenderness.  Musculoskeletal: Normal range of motion.  Lymphadenopathy:    She has no cervical adenopathy.    She has no axillary adenopathy.  Neurological: She is alert and oriented to person, place, and time. She has normal reflexes. No cranial nerve deficit or sensory deficit.  Skin: Skin is warm, dry and intact. No rash noted.  Psychiatric: She has a normal mood and affect. Her speech is normal and behavior is normal. Thought content normal.  Nursing note and vitals reviewed.   BP 102/62 mmHg  Pulse 64  Ht 5\' 6"  (1.676 m)  Wt 176 lb 6.4 oz (80.015 kg)  BMI 28.49 kg/m2  Assessment and Plan: 1. Annual physical exam Mammogram is up to date Prevnar-13 due at age 59 - POCT urinalysis dipstick  2. Hyperlipidemia Will advise on medication - Lipid panel  3. Insomnia Controlled on medication without side effects - zolpidem (AMBIEN) 10 MG tablet; Take 1 tablet (10 mg total) by mouth at bedtime.  Dispense: 90 tablet; Refill: 1  4. Major depressive disorder with single episode, in partial remission (HCC) Doing well on SSRI - TSH  5. Palpitations Controlled  on daily beta blocker - Comprehensive metabolic panel - metoprolol succinate (TOPROL-XL) 50 MG 24 hr tablet; Take 1 tablet (50 mg total) by mouth daily. Take with or immediately following a meal.  Dispense: 90 tablet; Refill: 3  6. Gastroesophageal reflux disease, esophagitis presence not specified Continue PPI - CBC with Differential/Platelet  7. Need for hepatitis C screening test - Hepatitis C antibody   Bari EdwardLaura Berglund, MD [facility]  09/15/2015

## 2015-09-15 NOTE — Patient Instructions (Signed)
Breast Self-Awareness Practicing breast self-awareness may pick up problems early, prevent significant medical complications, and possibly save your life. By practicing breast self-awareness, you can become familiar with how your breasts look and feel and if your breasts are changing. This allows you to notice changes early. It can also offer you some reassurance that your breast health is good. One way to learn what is normal for your breasts and whether your breasts are changing is to do a breast self-exam. If you find a lump or something that was not present in the past, it is best to contact your caregiver right away. Other findings that should be evaluated by your caregiver include nipple discharge, especially if it is bloody; skin changes or reddening; areas where the skin seems to be pulled in (retracted); or new lumps and bumps. Breast pain is seldom associated with cancer (malignancy), but should also be evaluated by a caregiver. HOW TO PERFORM A BREAST SELF-EXAM The best time to examine your breasts is 5-7 days after your menstrual period is over. During menstruation, the breasts are lumpier, and it may be more difficult to pick up changes. If you do not menstruate, have reached menopause, or had your uterus removed (hysterectomy), you should examine your breasts at regular intervals, such as monthly. If you are breastfeeding, examine your breasts after a feeding or after using a breast pump. Breast implants do not decrease the risk for lumps or tumors, so continue to perform breast self-exams as recommended. Talk to your caregiver about how to determine the difference between the implant and breast tissue. Also, talk about the amount of pressure you should use during the exam. Over time, you will become more familiar with the variations of your breasts and more comfortable with the exam. A breast self-exam requires you to remove all your clothes above the waist. 1. Look at your breasts and nipples.  Stand in front of a mirror in a room with good lighting. With your hands on your hips, push your hands firmly downward. Look for a difference in shape, contour, and size from one breast to the other (asymmetry). Asymmetry includes puckers, dips, or bumps. Also, look for skin changes, such as reddened or scaly areas on the breasts. Look for nipple changes, such as discharge, dimpling, repositioning, or redness. 2. Carefully feel your breasts. This is best done either in the shower or tub while using soapy water or when flat on your back. Place the arm (on the side of the breast you are examining) above your head. Use the pads (not the fingertips) of your three middle fingers on your opposite hand to feel your breasts. Start in the underarm area and use  inch (2 cm) overlapping circles to feel your breast. Use 3 different levels of pressure (light, medium, and firm pressure) at each circle before moving to the next circle. The light pressure is needed to feel the tissue closest to the skin. The medium pressure will help to feel breast tissue a little deeper, while the firm pressure is needed to feel the tissue close to the ribs. Continue the overlapping circles, moving downward over the breast until you feel your ribs below your breast. Then, move one finger-width towards the center of the body. Continue to use the  inch (2 cm) overlapping circles to feel your breast as you move slowly up toward the collar bone (clavicle) near the base of the neck. Continue the up and down exam using all 3 pressures until you reach the   middle of the chest. Do this with each breast, carefully feeling for lumps or changes. 3.  Keep a written record with breast changes or normal findings for each breast. By writing this information down, you do not need to depend only on memory for size, tenderness, or location. Write down where you are in your menstrual cycle, if you are still menstruating. Breast tissue can have some lumps or  thick tissue. However, see your caregiver if you find anything that concerns you.  SEEK MEDICAL CARE IF:  You see a change in shape, contour, or size of your breasts or nipples.   You see skin changes, such as reddened or scaly areas on the breasts or nipples.   You have an unusual discharge from your nipples.   You feel a new lump or unusually thick areas.    This information is not intended to replace advice given to you by your health care provider. Make sure you discuss any questions you have with your health care provider.   Document Released: 09/27/2005 Document Revised: 09/13/2012 Document Reviewed: 01/12/2012 Elsevier Interactive Patient Education 2016 [employer]  

## 2015-09-16 LAB — COMPREHENSIVE METABOLIC PANEL
ALBUMIN: 4.2 g/dL (ref 3.6–4.8)
ALK PHOS: 60 IU/L (ref 39–117)
ALT: 18 IU/L (ref 0–32)
AST: 18 IU/L (ref 0–40)
Albumin/Globulin Ratio: 2.1 (ref 1.1–2.5)
BILIRUBIN TOTAL: 0.6 mg/dL (ref 0.0–1.2)
BUN / CREAT RATIO: 18 (ref 11–26)
BUN: 14 mg/dL (ref 8–27)
CHLORIDE: 106 mmol/L (ref 97–106)
CO2: 22 mmol/L (ref 18–29)
Calcium: 9.3 mg/dL (ref 8.7–10.3)
Creatinine, Ser: 0.77 mg/dL (ref 0.57–1.00)
GFR calc Af Amer: 97 mL/min/{1.73_m2} (ref >59)
GFR calc non Af Amer: 84 mL/min/{1.73_m2} (ref >59)
Globulin, Total: 2 g/dL (ref 1.5–4.5)
Glucose: 81 mg/dL (ref 65–99)
Potassium: 4.7 mmol/L (ref 3.5–5.2)
SODIUM: 143 mmol/L (ref 136–144)
Total Protein: 6.2 g/dL (ref 6.0–8.5)

## 2015-09-16 LAB — CBC WITH DIFFERENTIAL/PLATELET
BASOS ABS: 0 10*3/uL (ref 0.0–0.2)
Basos: 1 %
EOS (ABSOLUTE): 0.2 10*3/uL (ref 0.0–0.4)
EOS: 3 %
HEMATOCRIT: 41.1 % (ref 34.0–46.6)
Hemoglobin: 13.6 g/dL (ref 11.1–15.9)
Immature Grans (Abs): 0 10*3/uL (ref 0.0–0.1)
Immature Granulocytes: 0 %
LYMPHS ABS: 1.5 10*3/uL (ref 0.7–3.1)
Lymphs: 35 %
MCH: 30 pg (ref 26.6–33.0)
MCHC: 33.1 g/dL (ref 31.5–35.7)
MCV: 91 fL (ref 79–97)
MONOCYTES: 8 %
Monocytes Absolute: 0.3 10*3/uL (ref 0.1–0.9)
NEUTROS ABS: 2.3 10*3/uL (ref 1.4–7.0)
Neutrophils: 53 %
Platelets: 228 10*3/uL (ref 150–379)
RBC: 4.53 x10E6/uL (ref 3.77–5.28)
RDW: 13.8 % (ref 12.3–15.4)
WBC: 4.4 10*3/uL (ref 3.4–10.8)

## 2015-09-16 LAB — LIPID PANEL
CHOL/HDL RATIO: 5.6 ratio — AB (ref 0.0–4.4)
Cholesterol, Total: 208 mg/dL — ABNORMAL HIGH (ref 100–199)
HDL: 37 mg/dL — AB (ref >39)
LDL CALC: 130 mg/dL — AB (ref 0–99)
Triglycerides: 204 mg/dL — ABNORMAL HIGH (ref 0–149)
VLDL Cholesterol Cal: 41 mg/dL — ABNORMAL HIGH (ref 5–40)

## 2015-09-16 LAB — TSH: TSH: 1.33 u[IU]/mL (ref 0.450–4.500)

## 2015-09-16 LAB — HEPATITIS C ANTIBODY: Hep C Virus Ab: <0.1 s/co ratio (ref 0.0–0.9)

## 2015-09-25 ENCOUNTER — Other Ambulatory Visit: Payer: Self-pay | Admitting: Internal Medicine

## 2015-09-25 MED ORDER — FLUTICASONE PROPIONATE 50 MCG/ACT NA SUSP: 2.0000 | Freq: Every day | NASAL | Status: AC

## 2015-10-07 ENCOUNTER — Other Ambulatory Visit: Payer: Self-pay | Admitting: Internal Medicine

## 2015-10-07 MED ORDER — BUPROPION HCL ER (XL) 150 MG PO TB24: 150.0000 mg | ORAL_TABLET | Freq: Every day | ORAL | Status: AC

## 2015-10-15 ENCOUNTER — Telehealth: Payer: Self-pay

## 2015-10-15 NOTE — Telephone Encounter (Signed)
Spoke with patient. Patient advised and has cancelled her 11/04/2015 appointment. Thanks

## 2015-10-15 NOTE — Telephone Encounter (Signed)
She can stop the Wellbutrin immediately with no need to taper.  I believe she is describing side effects to it which should stop when she stops the medication.  I recommend waiting several weeks then come in for an OV to discuss change in therapy if needed.

## 2015-10-15 NOTE — Telephone Encounter (Signed)
Patient came in to the office today. States that she was started on Wellbutrin one week ago. Patient reports that she is having vivid dreams where she is wrapping a box each night and yelling out in her sleep. Patient also states that she is waking up at 4 am every morning wide awake. She states that this is the most "Awake" she has been in years. Patient also states that she is having severe dry mouth. She states that she is still on Citalopram and would like to know if she could stop the Wellbutrin now without tapering or should she taper off of the medication. She also wanted to know what your opinion on all of these symptoms is. Thanks!

## 2015-11-04 ENCOUNTER — Ambulatory Visit: Payer: 59 | Admitting: Internal Medicine

## 2015-11-07 ENCOUNTER — Other Ambulatory Visit: Payer: Self-pay | Admitting: Internal Medicine

## 2015-11-07 MED ORDER — VALACYCLOVIR HCL 1 G PO TABS: 1000.0000 mg | ORAL_TABLET | Freq: Every day | ORAL | Status: DC

## 2015-12-15 ENCOUNTER — Other Ambulatory Visit: Payer: Self-pay

## 2015-12-15 DIAGNOSIS — R002 Palpitations: Secondary | ICD-10-CM

## 2015-12-15 MED ORDER — METOPROLOL SUCCINATE ER 50 MG PO TB24: 50.0000 mg | ORAL_TABLET | Freq: Every day | ORAL | Status: DC

## 2015-12-15 NOTE — Telephone Encounter (Signed)
Patient called in requesting refill

## 2016-01-26 ENCOUNTER — Other Ambulatory Visit: Payer: Self-pay

## 2016-01-26 DIAGNOSIS — G4700 Insomnia, unspecified: Secondary | ICD-10-CM

## 2016-01-26 DIAGNOSIS — R002 Palpitations: Secondary | ICD-10-CM

## 2016-01-26 MED ORDER — ZOLPIDEM TARTRATE 10 MG PO TABS: 10.0000 mg | ORAL_TABLET | Freq: Every day | ORAL | Status: AC

## 2016-01-26 MED ORDER — PANTOPRAZOLE SODIUM 40 MG PO TBEC: 40.0000 mg | DELAYED_RELEASE_TABLET | Freq: Every day | ORAL | Status: AC

## 2016-01-26 MED ORDER — CITALOPRAM HYDROBROMIDE 40 MG PO TABS: 40.0000 mg | ORAL_TABLET | Freq: Every day | ORAL | Status: AC

## 2016-01-26 MED ORDER — VALACYCLOVIR HCL 1 G PO TABS: 1000.0000 mg | ORAL_TABLET | Freq: Every day | ORAL | Status: AC

## 2016-01-26 MED ORDER — METOPROLOL SUCCINATE ER 50 MG PO TB24: 50.0000 mg | ORAL_TABLET | Freq: Every day | ORAL | Status: DC

## 2016-01-26 NOTE — Telephone Encounter (Signed)
Received fax from new mail order pharmacy requesting refills.

## 2016-12-28 ENCOUNTER — Other Ambulatory Visit: Payer: Self-pay | Admitting: Internal Medicine

## 2016-12-28 DIAGNOSIS — R002 Palpitations: Secondary | ICD-10-CM

## 2017-01-03 NOTE — Telephone Encounter (Signed)
Phone number is not inservice.

## 2017-01-10 ENCOUNTER — Other Ambulatory Visit: Payer: Self-pay | Admitting: Internal Medicine

## 2024-05-10 ENCOUNTER — Other Ambulatory Visit: Payer: Self-pay | Admitting: Medical Genetics

## 2024-05-25 ENCOUNTER — Other Ambulatory Visit
Admission: RE | Admit: 2024-05-25 | Discharge: 2024-05-25 | Disposition: A | Discharge status: Home / Self Care | Payer: Self-pay | Source: Ambulatory Visit | Attending: Medical Genetics | Admitting: Medical Genetics

## 2024-06-04 LAB — GENECONNECT MOLECULAR SCREEN: Genetic Analysis Overall Interpretation: NEGATIVE
